# Patient Record
Sex: Female | Born: 1975 | Race: White | Hispanic: No | Marital: Single | Smoking: Former smoker
Health system: Southern US, Community
[De-identification: ages and names within clinical notes are randomized; demographics above are authoritative.]

## PROBLEM LIST (undated history)

## (undated) DIAGNOSIS — C439 Malignant melanoma of skin, unspecified: Secondary | ICD-10-CM

## (undated) DIAGNOSIS — F419 Anxiety disorder, unspecified: Secondary | ICD-10-CM

## (undated) DIAGNOSIS — R87619 Unspecified abnormal cytological findings in specimens from cervix uteri: Secondary | ICD-10-CM

## (undated) DIAGNOSIS — F319 Bipolar disorder, unspecified: Secondary | ICD-10-CM

## (undated) DIAGNOSIS — IMO0002 Reserved for concepts with insufficient information to code with codable children: Secondary | ICD-10-CM

---

## 1999-12-12 ENCOUNTER — Inpatient Hospital Stay (HOSPITAL_COMMUNITY): Admission: EM | Admit: 1999-12-12 | Discharge: 1999-12-19 | Payer: Self-pay | Admitting: *Deleted

## 1999-12-21 ENCOUNTER — Emergency Department (HOSPITAL_COMMUNITY): Admission: EM | Admit: 1999-12-21 | Discharge: 1999-12-21 | Payer: Self-pay | Admitting: Emergency Medicine

## 2000-06-20 ENCOUNTER — Encounter: Payer: Self-pay | Admitting: Emergency Medicine

## 2000-06-20 ENCOUNTER — Emergency Department (HOSPITAL_COMMUNITY): Admission: EM | Admit: 2000-06-20 | Discharge: 2000-06-20 | Payer: Self-pay | Admitting: Emergency Medicine

## 2010-11-21 ENCOUNTER — Inpatient Hospital Stay (HOSPITAL_COMMUNITY): Payer: Medicare Other

## 2010-11-21 ENCOUNTER — Inpatient Hospital Stay (HOSPITAL_COMMUNITY)
Admission: AD | Admit: 2010-11-21 | Discharge: 2010-11-21 | Disposition: A | Discharge status: Home / Self Care | Payer: Medicare Other | Source: Ambulatory Visit | Attending: Obstetrics & Gynecology | Admitting: Obstetrics & Gynecology

## 2010-11-21 DIAGNOSIS — R109 Unspecified abdominal pain: Secondary | ICD-10-CM | POA: Insufficient documentation

## 2010-11-21 DIAGNOSIS — O99891 Other specified diseases and conditions complicating pregnancy: Secondary | ICD-10-CM | POA: Insufficient documentation

## 2010-11-21 LAB — WET PREP, GENITAL
Trich, Wet Prep: NONE SEEN
Yeast Wet Prep HPF POC: NONE SEEN

## 2010-11-21 LAB — URINALYSIS, ROUTINE W REFLEX MICROSCOPIC
Bilirubin Urine: NEGATIVE
Glucose, UA: NEGATIVE mg/dL
Hgb urine dipstick: NEGATIVE
Ketones, ur: NEGATIVE mg/dL
Nitrite: NEGATIVE
Protein, ur: NEGATIVE mg/dL
Specific Gravity, Urine: >1.03 — ABNORMAL HIGH (ref 1.005–1.030)
Urobilinogen, UA: 1 mg/dL (ref 0.0–1.0)
pH: 5.5 (ref 5.0–8.0)

## 2010-11-21 LAB — CBC
HCT: 34.1 % — ABNORMAL LOW (ref 36.0–46.0)
Hemoglobin: 11.5 g/dL — ABNORMAL LOW (ref 12.0–15.0)
MCH: 29.6 pg (ref 26.0–34.0)
MCHC: 33.7 g/dL (ref 30.0–36.0)
MCV: 87.7 fL (ref 78.0–100.0)
Platelets: 251 10*3/uL (ref 150–400)
RBC: 3.89 MIL/uL (ref 3.87–5.11)
RDW: 12.6 % (ref 11.5–15.5)
WBC: 7.2 10*3/uL (ref 4.0–10.5)

## 2010-11-21 LAB — POCT PREGNANCY, URINE: Preg Test, Ur: POSITIVE

## 2010-11-21 LAB — HCG, QUANTITATIVE, PREGNANCY: hCG, Beta Chain, Quant, S: 107738 m[IU]/mL — ABNORMAL HIGH (ref <5)

## 2010-11-22 LAB — GC/CHLAMYDIA PROBE AMP, GENITAL: GC Probe Amp, Genital: NEGATIVE

## 2010-12-27 LAB — RPR: RPR: NONREACTIVE

## 2010-12-27 LAB — ANTIBODY SCREEN: Antibody Screen: NEGATIVE

## 2010-12-27 LAB — ABO/RH: RH Type: POSITIVE

## 2010-12-27 LAB — RUBELLA ANTIBODY, IGM: Rubella: IMMUNE

## 2011-01-04 ENCOUNTER — Ambulatory Visit (HOSPITAL_COMMUNITY)
Admission: RE | Admit: 2011-01-04 | Discharge: 2011-01-04 | Disposition: A | Discharge status: Home / Self Care | Payer: Medicaid Other | Source: Ambulatory Visit | Attending: Obstetrics & Gynecology | Admitting: Obstetrics & Gynecology

## 2011-03-07 ENCOUNTER — Inpatient Hospital Stay (HOSPITAL_COMMUNITY)
Admission: AD | Admit: 2011-03-07 | Discharge: 2011-03-07 | Disposition: A | Discharge status: Home / Self Care | Payer: Medicaid Other | Source: Ambulatory Visit | Attending: Obstetrics & Gynecology | Admitting: Obstetrics & Gynecology

## 2011-03-07 DIAGNOSIS — O9989 Other specified diseases and conditions complicating pregnancy, childbirth and the puerperium: Secondary | ICD-10-CM

## 2011-03-07 DIAGNOSIS — O4690 Antepartum hemorrhage, unspecified, unspecified trimester: Secondary | ICD-10-CM

## 2011-03-07 DIAGNOSIS — O99891 Other specified diseases and conditions complicating pregnancy: Secondary | ICD-10-CM | POA: Insufficient documentation

## 2011-03-07 DIAGNOSIS — O99340 Other mental disorders complicating pregnancy, unspecified trimester: Secondary | ICD-10-CM

## 2011-03-07 DIAGNOSIS — F319 Bipolar disorder, unspecified: Secondary | ICD-10-CM | POA: Insufficient documentation

## 2011-03-07 DIAGNOSIS — J45909 Unspecified asthma, uncomplicated: Secondary | ICD-10-CM | POA: Insufficient documentation

## 2011-03-07 LAB — URINALYSIS, ROUTINE W REFLEX MICROSCOPIC
Glucose, UA: NEGATIVE mg/dL
Hgb urine dipstick: NEGATIVE
Ketones, ur: NEGATIVE mg/dL
Protein, ur: NEGATIVE mg/dL

## 2011-03-07 LAB — WET PREP, GENITAL
Trich, Wet Prep: NONE SEEN
Yeast Wet Prep HPF POC: NONE SEEN

## 2011-06-12 ENCOUNTER — Encounter (HOSPITAL_COMMUNITY): Payer: Self-pay

## 2011-06-12 ENCOUNTER — Inpatient Hospital Stay (HOSPITAL_COMMUNITY)
Admission: AD | Admit: 2011-06-12 | Discharge: 2011-06-12 | Disposition: A | Discharge status: Home / Self Care | Payer: Medicaid Other | Source: Ambulatory Visit | Attending: Obstetrics & Gynecology | Admitting: Obstetrics & Gynecology

## 2011-06-12 DIAGNOSIS — O479 False labor, unspecified: Secondary | ICD-10-CM | POA: Insufficient documentation

## 2011-06-12 HISTORY — DX: Anxiety disorder, unspecified: F41.9

## 2011-06-12 HISTORY — DX: Bipolar disorder, unspecified: F31.9

## 2011-06-12 HISTORY — DX: Unspecified abnormal cytological findings in specimens from cervix uteri: R87.619

## 2011-06-12 HISTORY — DX: Malignant melanoma of skin, unspecified: C43.9

## 2011-06-12 HISTORY — DX: Reserved for concepts with insufficient information to code with codable children: IMO0002

## 2011-06-12 NOTE — Progress Notes (Signed)
Pt states she is having contractions every 9-10 minutes. Has not been able to sleep. Pt is scheduled for a repeat cesarean section on 10-10 with a due date of 10-17. No leaking or bleeding. Reports movement but not as much as usual.

## 2011-06-13 ENCOUNTER — Other Ambulatory Visit: Payer: Self-pay | Admitting: Obstetrics & Gynecology

## 2011-06-14 ENCOUNTER — Encounter (HOSPITAL_COMMUNITY): Payer: Self-pay | Admitting: *Deleted

## 2011-06-14 ENCOUNTER — Inpatient Hospital Stay (HOSPITAL_COMMUNITY)
Admission: AD | Admit: 2011-06-14 | Discharge: 2011-06-14 | Disposition: A | Discharge status: Home / Self Care | Payer: Medicaid Other | Source: Ambulatory Visit | Attending: Obstetrics & Gynecology | Admitting: Obstetrics & Gynecology

## 2011-06-14 DIAGNOSIS — O479 False labor, unspecified: Secondary | ICD-10-CM | POA: Insufficient documentation

## 2011-06-14 NOTE — ED Notes (Signed)
E-signature not working so pt signed paper stating received d/c instructions.

## 2011-06-14 NOTE — ED Notes (Signed)
Dr Gaynell Face notified of pt's admission and status. Aware of ctx pattern, sve x 2 without change in cervix. Aware of sch repeat c/s 10/10 and concern that cervix did not dilate with prior labor. Pt may go home.

## 2011-06-14 NOTE — ED Notes (Signed)
FOB stating pt is hurting and does not understand why C/S not done tonight. Pt did not dilate before and had to have emergent C/S due to FHR dropping. Discussed with pt and spouse ways to help pt be more comfortable. Labor progress discussed with couple.

## 2011-06-14 NOTE — ED Notes (Signed)
2233 When Dr Gaynell Face notified of pt's admission, first stated could give pt Terb and see if ctxs stopped. Told pt had taken self off monitor and was dressed sitting in chair. MD then stated pt could go home with labor precautions. Pt was aware Terb was an option but denied wanting that. States "I just want nature to take it's course".

## 2011-06-14 NOTE — Progress Notes (Signed)
G3P1 at 37.3wks. Hx prior C/S and desires C/S and sch for 10/10. Ctxs since 1030 and stronger since 1300. Denies leaking fld or bleeding.

## 2011-06-14 NOTE — Progress Notes (Signed)
Pt states, " I've had contractions since 10:30, and more regular at 1 pm. And they are 2-3 min apart. I might be leaking because my panties are staying damp."

## 2011-06-14 NOTE — Progress Notes (Signed)
Written and verbal d/c instructions given and understanding vocied. Discussed comfort measures-warm showers/baths, position changes.

## 2011-06-20 ENCOUNTER — Encounter (HOSPITAL_COMMUNITY): Payer: Self-pay | Admitting: Anesthesiology

## 2011-06-20 ENCOUNTER — Inpatient Hospital Stay (HOSPITAL_COMMUNITY): Payer: Medicare Other | Admitting: Anesthesiology

## 2011-06-20 ENCOUNTER — Other Ambulatory Visit: Payer: Self-pay | Admitting: Obstetrics & Gynecology

## 2011-06-20 ENCOUNTER — Encounter (HOSPITAL_COMMUNITY)
Admission: AD | Disposition: A | Discharge status: Home / Self Care | Payer: Self-pay | Source: Ambulatory Visit | Attending: Obstetrics & Gynecology

## 2011-06-20 ENCOUNTER — Inpatient Hospital Stay (HOSPITAL_COMMUNITY)
Admission: AD | Admit: 2011-06-20 | Discharge: 2011-06-23 | DRG: 766 | Disposition: A | Discharge status: Home / Self Care | Payer: Medicare Other | Source: Ambulatory Visit | Attending: Obstetrics & Gynecology | Admitting: Obstetrics & Gynecology

## 2011-06-20 ENCOUNTER — Encounter (HOSPITAL_COMMUNITY): Payer: Self-pay | Admitting: Surgery

## 2011-06-20 DIAGNOSIS — O34219 Maternal care for unspecified type scar from previous cesarean delivery: Principal | ICD-10-CM | POA: Diagnosis present

## 2011-06-20 DIAGNOSIS — Z302 Encounter for sterilization: Secondary | ICD-10-CM

## 2011-06-20 DIAGNOSIS — F1120 Opioid dependence, uncomplicated: Secondary | ICD-10-CM | POA: Diagnosis present

## 2011-06-20 LAB — CBC
MCHC: 33.8 g/dL (ref 30.0–36.0)
MCV: 91.9 fL (ref 78.0–100.0)
Platelets: 252 10*3/uL (ref 150–400)
RDW: 13.2 % (ref 11.5–15.5)
WBC: 9.6 10*3/uL (ref 4.0–10.5)

## 2011-06-20 LAB — TYPE AND SCREEN: Antibody Screen: NEGATIVE

## 2011-06-20 SURGERY — Surgical Case
Anesthesia: Spinal | Site: Abdomen | Wound class: Clean Contaminated

## 2011-06-20 MED ORDER — FENTANYL CITRATE 0.05 MG/ML IJ SOLN
INTRAMUSCULAR | Status: AC
  Administered 2011-06-20: 50 ug via INTRAVENOUS
  Filled 2011-06-20: qty 2

## 2011-06-20 MED ORDER — SCOPOLAMINE 1 MG/3DAYS TD PT72
MEDICATED_PATCH | TRANSDERMAL | Status: AC
  Administered 2011-06-20: 1.5 mg via TRANSDERMAL
  Filled 2011-06-20: qty 1

## 2011-06-20 MED ORDER — ONDANSETRON HCL 4 MG/2ML IJ SOLN
INTRAMUSCULAR | Status: DC | PRN
  Administered 2011-06-20: 4 mg via INTRAVENOUS

## 2011-06-20 MED ORDER — ACETAMINOPHEN 325 MG PO TABS: 325.0000 mg | ORAL_TABLET | ORAL | Status: DC | PRN

## 2011-06-20 MED ORDER — SCOPOLAMINE 1 MG/3DAYS TD PT72
1.0000 | MEDICATED_PATCH | TRANSDERMAL | Status: DC
  Administered 2011-06-20: 1.5 mg via TRANSDERMAL

## 2011-06-20 MED ORDER — PROMETHAZINE HCL 25 MG/ML IJ SOLN: 6.2500 mg | INTRAMUSCULAR | Status: DC | PRN

## 2011-06-20 MED ORDER — LACTATED RINGERS IV SOLN
INTRAVENOUS | Status: DC
  Administered 2011-06-21: 02:00:00 via INTRAVENOUS

## 2011-06-20 MED ORDER — DIPHENHYDRAMINE HCL 25 MG PO CAPS: 25.0000 mg | ORAL_CAPSULE | ORAL | Status: DC | PRN

## 2011-06-20 MED ORDER — METOCLOPRAMIDE HCL 5 MG/ML IJ SOLN: 10.0000 mg | Freq: Three times a day (TID) | INTRAMUSCULAR | Status: DC | PRN

## 2011-06-20 MED ORDER — KETOROLAC TROMETHAMINE 30 MG/ML IJ SOLN
30.0000 mg | Freq: Four times a day (QID) | INTRAMUSCULAR | Status: AC | PRN
  Administered 2011-06-21: 30 mg via INTRAVENOUS
  Filled 2011-06-20: qty 1

## 2011-06-20 MED ORDER — LANOLIN HYDROUS EX OINT: 1.0000 "application " | TOPICAL_OINTMENT | CUTANEOUS | Status: DC | PRN

## 2011-06-20 MED ORDER — ONDANSETRON HCL 4 MG/2ML IJ SOLN: 4.0000 mg | Freq: Three times a day (TID) | INTRAMUSCULAR | Status: DC | PRN

## 2011-06-20 MED ORDER — IBUPROFEN 600 MG PO TABS
600.0000 mg | ORAL_TABLET | Freq: Four times a day (QID) | ORAL | Status: DC | PRN
  Administered 2011-06-22 (×2): 600 mg via ORAL
  Filled 2011-06-20 (×5): qty 1

## 2011-06-20 MED ORDER — MEPERIDINE HCL 25 MG/ML IJ SOLN
INTRAMUSCULAR | Status: AC
  Administered 2011-06-20: 12.5 mg via INTRAVENOUS
  Filled 2011-06-20: qty 1

## 2011-06-20 MED ORDER — PRENATAL PLUS 27-1 MG PO TABS
1.0000 | ORAL_TABLET | Freq: Every day | ORAL | Status: DC
  Administered 2011-06-22 – 2011-06-23 (×3): 1 via ORAL
  Filled 2011-06-20 (×2): qty 1

## 2011-06-20 MED ORDER — FENTANYL CITRATE 0.05 MG/ML IJ SOLN
INTRAMUSCULAR | Status: DC | PRN
  Administered 2011-06-20: 25 ug via INTRATHECAL

## 2011-06-20 MED ORDER — MORPHINE SULFATE (PF) 0.5 MG/ML IJ SOLN
INTRAMUSCULAR | Status: DC | PRN
  Administered 2011-06-20: .1 mg via INTRATHECAL

## 2011-06-20 MED ORDER — SODIUM CHLORIDE 0.9 % IV SOLN: 1.0000 ug/kg/h | INTRAVENOUS | Status: DC | PRN

## 2011-06-20 MED ORDER — FENTANYL CITRATE 0.05 MG/ML IJ SOLN
25.0000 ug | INTRAMUSCULAR | Status: DC | PRN
  Administered 2011-06-20 (×4): 50 ug via INTRAVENOUS

## 2011-06-20 MED ORDER — ACETAMINOPHEN 10 MG/ML IV SOLN
1000.0000 mg | Freq: Four times a day (QID) | INTRAVENOUS | Status: AC | PRN
  Filled 2011-06-20: qty 100

## 2011-06-20 MED ORDER — NALBUPHINE HCL 10 MG/ML IJ SOLN: 5.0000 mg | INTRAMUSCULAR | Status: DC | PRN

## 2011-06-20 MED ORDER — BUPIVACAINE HCL 0.25 % IJ SOLN
INTRAMUSCULAR | Status: DC | PRN
  Administered 2011-06-20: 30 mL

## 2011-06-20 MED ORDER — OXYCODONE-ACETAMINOPHEN 5-325 MG PO TABS
1.0000 | ORAL_TABLET | ORAL | Status: DC | PRN
  Administered 2011-06-21 (×2): 1 via ORAL
  Administered 2011-06-21: 2 via ORAL
  Administered 2011-06-21: 1 via ORAL
  Administered 2011-06-22: 2 via ORAL
  Filled 2011-06-20 (×2): qty 1
  Filled 2011-06-20 (×2): qty 2
  Filled 2011-06-20: qty 1

## 2011-06-20 MED ORDER — ONDANSETRON HCL 4 MG/2ML IJ SOLN: 4.0000 mg | INTRAMUSCULAR | Status: DC | PRN

## 2011-06-20 MED ORDER — KETOROLAC TROMETHAMINE 60 MG/2ML IM SOLN
INTRAMUSCULAR | Status: AC
  Administered 2011-06-20: 60 mg
  Filled 2011-06-20: qty 2

## 2011-06-20 MED ORDER — OXYTOCIN 20 UNITS IN LACTATED RINGERS INFUSION - SIMPLE
INTRAVENOUS | Status: DC | PRN
  Administered 2011-06-20 (×2): 20 [IU] via INTRAVENOUS

## 2011-06-20 MED ORDER — LACTATED RINGERS IV SOLN
INTRAVENOUS | Status: DC
  Administered 2011-06-20: 1000 mL via INTRAVENOUS

## 2011-06-20 MED ORDER — TETANUS-DIPHTH-ACELL PERTUSSIS 5-2.5-18.5 LF-MCG/0.5 IM SUSP: 0.5000 mL | Freq: Once | INTRAMUSCULAR | Status: DC

## 2011-06-20 MED ORDER — LACTATED RINGERS IV SOLN
INTRAVENOUS | Status: DC | PRN
  Administered 2011-06-20 (×5): via INTRAVENOUS

## 2011-06-20 MED ORDER — BUPIVACAINE IN DEXTROSE 0.75-8.25 % IT SOLN
INTRATHECAL | Status: DC | PRN
  Administered 2011-06-20: 11 mg via INTRATHECAL

## 2011-06-20 MED ORDER — DIPHENHYDRAMINE HCL 50 MG/ML IJ SOLN: 25.0000 mg | INTRAMUSCULAR | Status: DC | PRN

## 2011-06-20 MED ORDER — FERROUS SULFATE 325 (65 FE) MG PO TABS
325.0000 mg | ORAL_TABLET | Freq: Two times a day (BID) | ORAL | Status: DC
  Administered 2011-06-23: 325 mg via ORAL
  Filled 2011-06-20 (×2): qty 1

## 2011-06-20 MED ORDER — METHADONE HCL 10 MG/ML PO CONC
115.0000 mg | Freq: Every day | ORAL | Status: DC
  Administered 2011-06-21 – 2011-06-23 (×3): 115 mg via ORAL
  Filled 2011-06-20 (×5): qty 11.5

## 2011-06-20 MED ORDER — ONDANSETRON HCL 4 MG PO TABS: 4.0000 mg | ORAL_TABLET | ORAL | Status: DC | PRN

## 2011-06-20 MED ORDER — SODIUM CHLORIDE 0.9 % IJ SOLN: 3.0000 mL | INTRAMUSCULAR | Status: DC | PRN

## 2011-06-20 MED ORDER — SENNOSIDES-DOCUSATE SODIUM 8.6-50 MG PO TABS
2.0000 | ORAL_TABLET | Freq: Every day | ORAL | Status: DC
  Administered 2011-06-21 – 2011-06-22 (×2): 2 via ORAL

## 2011-06-20 MED ORDER — OXYTOCIN 20 UNITS IN LACTATED RINGERS INFUSION - SIMPLE: 125.0000 mL/h | INTRAVENOUS | Status: AC

## 2011-06-20 MED ORDER — CEFAZOLIN SODIUM-DEXTROSE 2-3 GM-% IV SOLR
2.0000 g | Freq: Once | INTRAVENOUS | Status: AC
  Administered 2011-06-20: 2 g via INTRAVENOUS
  Filled 2011-06-20: qty 50

## 2011-06-20 MED ORDER — SODIUM CHLORIDE 0.9 % IR SOLN
Status: DC | PRN
  Administered 2011-06-20: 1000 mL

## 2011-06-20 MED ORDER — CITRIC ACID-SODIUM CITRATE 334-500 MG/5ML PO SOLN
30.0000 mL | Freq: Once | ORAL | Status: AC
  Administered 2011-06-20: 30 mL via ORAL
  Filled 2011-06-20: qty 15

## 2011-06-20 MED ORDER — KETOROLAC TROMETHAMINE 30 MG/ML IJ SOLN: 30.0000 mg | Freq: Four times a day (QID) | INTRAMUSCULAR | Status: AC | PRN

## 2011-06-20 MED ORDER — NALOXONE HCL 0.4 MG/ML IJ SOLN: 0.4000 mg | INTRAMUSCULAR | Status: DC | PRN

## 2011-06-20 MED ORDER — WITCH HAZEL-GLYCERIN EX PADS: 1.0000 "application " | MEDICATED_PAD | CUTANEOUS | Status: DC | PRN

## 2011-06-20 MED ORDER — SCOPOLAMINE 1 MG/3DAYS TD PT72: 1.0000 | MEDICATED_PATCH | Freq: Once | TRANSDERMAL | Status: DC

## 2011-06-20 MED ORDER — DIPHENHYDRAMINE HCL 50 MG/ML IJ SOLN: 12.5000 mg | INTRAMUSCULAR | Status: DC | PRN

## 2011-06-20 MED ORDER — SIMETHICONE 80 MG PO CHEW
80.0000 mg | CHEWABLE_TABLET | ORAL | Status: DC | PRN
  Administered 2011-06-21 – 2011-06-22 (×2): 80 mg via ORAL

## 2011-06-20 MED ORDER — DIBUCAINE 1 % RE OINT: 1.0000 "application " | TOPICAL_OINTMENT | RECTAL | Status: DC | PRN

## 2011-06-20 MED ORDER — IBUPROFEN 600 MG PO TABS
600.0000 mg | ORAL_TABLET | Freq: Four times a day (QID) | ORAL | Status: DC
  Administered 2011-06-21 – 2011-06-23 (×5): 600 mg via ORAL
  Filled 2011-06-20 (×2): qty 1

## 2011-06-20 MED ORDER — KETOROLAC TROMETHAMINE 30 MG/ML IJ SOLN: 30.0000 mg | Freq: Four times a day (QID) | INTRAMUSCULAR | Status: DC | PRN

## 2011-06-20 MED ORDER — MEASLES, MUMPS & RUBELLA VAC ~~LOC~~ INJ: 0.5000 mL | INJECTION | Freq: Once | SUBCUTANEOUS | Status: DC

## 2011-06-20 MED ORDER — MEPERIDINE HCL 25 MG/ML IJ SOLN
6.2500 mg | INTRAMUSCULAR | Status: AC | PRN
  Administered 2011-06-20 (×2): 12.5 mg via INTRAVENOUS

## 2011-06-20 MED ORDER — MAGNESIUM HYDROXIDE 400 MG/5ML PO SUSP: 30.0000 mL | ORAL | Status: DC | PRN

## 2011-06-20 SURGICAL SUPPLY — 45 items
BENZOIN TINCTURE PRP APPL 2/3 (GAUZE/BANDAGES/DRESSINGS) ×3 IMPLANT
CANISTER WOUND CARE 500ML ATS (WOUND CARE) IMPLANT
CHLORAPREP W/TINT 26ML (MISCELLANEOUS) ×3 IMPLANT
CLOSURE STERI STRIP 1/2 X4 (GAUZE/BANDAGES/DRESSINGS) ×3 IMPLANT
CLOTH BEACON ORANGE TIMEOUT ST (SAFETY) ×3 IMPLANT
CONTAINER PREFILL 10% NBF 15ML (MISCELLANEOUS) IMPLANT
DERMABOND ADVANCED (GAUZE/BANDAGES/DRESSINGS) ×1
DERMABOND ADVANCED .7 DNX12 (GAUZE/BANDAGES/DRESSINGS) ×2 IMPLANT
DRESSING TELFA 8X3 (GAUZE/BANDAGES/DRESSINGS) ×3 IMPLANT
DRSG PAD ABDOMINAL 8X10 ST (GAUZE/BANDAGES/DRESSINGS) ×3 IMPLANT
DRSG VAC ATS LRG SENSATRAC (GAUZE/BANDAGES/DRESSINGS) IMPLANT
DRSG VAC ATS MED SENSATRAC (GAUZE/BANDAGES/DRESSINGS) IMPLANT
DRSG VAC ATS SM SENSATRAC (GAUZE/BANDAGES/DRESSINGS) IMPLANT
ELECT REM PT RETURN 9FT ADLT (ELECTROSURGICAL) ×3
ELECTRODE REM PT RTRN 9FT ADLT (ELECTROSURGICAL) ×2 IMPLANT
EXTRACTOR VACUUM M CUP 4 TUBE (SUCTIONS) IMPLANT
GAUZE SPONGE 4X4 12PLY STRL LF (GAUZE/BANDAGES/DRESSINGS) ×6 IMPLANT
GLOVE BIO SURGEON STRL SZ 6.5 (GLOVE) ×6 IMPLANT
GOWN PREVENTION PLUS LG XLONG (DISPOSABLE) ×9 IMPLANT
KIT ABG SYR 3ML LUER SLIP (SYRINGE) IMPLANT
NEEDLE HYPO 25X5/8 SAFETYGLIDE (NEEDLE) ×3 IMPLANT
NS IRRIG 1000ML POUR BTL (IV SOLUTION) ×3 IMPLANT
PACK C SECTION WH (CUSTOM PROCEDURE TRAY) ×3 IMPLANT
PAD ABD 7.5X8 STRL (GAUZE/BANDAGES/DRESSINGS) ×3 IMPLANT
RTRCTR C-SECT PINK 25CM LRG (MISCELLANEOUS) ×3 IMPLANT
RTRCTR C-SECT PINK 34CM XLRG (MISCELLANEOUS) IMPLANT
SLEEVE SCD COMPRESS KNEE MED (MISCELLANEOUS) IMPLANT
SPONGE GAUZE 4X4 12PLY (GAUZE/BANDAGES/DRESSINGS) ×3 IMPLANT
STAPLER VISISTAT 35W (STAPLE) IMPLANT
SUT MNCRL 0 VIOLET CTX 36 (SUTURE) ×4 IMPLANT
SUT MNCRL AB 3-0 PS2 27 (SUTURE) ×3 IMPLANT
SUT MONOCRYL 0 CTX 36 (SUTURE) ×2
SUT PDS AB 0 CTX 36 PDP370T (SUTURE) ×3 IMPLANT
SUT PLAIN 0 NONE (SUTURE) IMPLANT
SUT VIC AB 0 CT1 27 (SUTURE)
SUT VIC AB 0 CT1 27XBRD ANBCTR (SUTURE) IMPLANT
SUT VIC AB 2-0 CT1 (SUTURE) IMPLANT
SUT VIC AB 2-0 CT1 27 (SUTURE) ×1
SUT VIC AB 2-0 CT1 TAPERPNT 27 (SUTURE) ×2 IMPLANT
SUT VIC AB 2-0 SH 27 (SUTURE)
SUT VIC AB 2-0 SH 27XBRD (SUTURE) IMPLANT
TAPE CLOTH SURG 4X10 WHT LF (GAUZE/BANDAGES/DRESSINGS) ×3 IMPLANT
TOWEL OR 17X24 6PK STRL BLUE (TOWEL DISPOSABLE) ×6 IMPLANT
TRAY FOLEY CATH 14FR (SET/KITS/TRAYS/PACK) ×3 IMPLANT
WATER STERILE IRR 1000ML POUR (IV SOLUTION) ×3 IMPLANT

## 2011-06-20 NOTE — H&P (Signed)
Theresa Boyer is a 34 y.o. female presenting for contractions. Maternal Medical History:  Reason for admission: Reason for admission: contractions.  Reason for Admission:   nauseaH/O a previous C/D.  Declines TOLAC.  Contractions: Onset was 2 days ago.   Frequency: regular.   Perceived severity is moderate.    Fetal activity: Perceived fetal activity is normal.    Prenatal complications: no prenatal complications   OB History    Grav Para Term Preterm Abortions TAB SAB Ect Mult Living   3 1 1  0 1 1 0 0 0 1     Past Medical History  Diagnosis Date  . Bipolar 1 disorder   . Anxiety   . Abnormal Pap smear   . Melanoma   . Asthma    Past Surgical History  Procedure Date  . Cesarean section    Family History: family history is not on file. Social History:  reports that she has been smoking.  She has never used smokeless tobacco. She reports that she uses illicit drugs. She reports that she does not drink alcohol.  Review of Systems  Constitutional: Negative for fever.  Eyes: Negative for blurred vision.  Respiratory: Negative for shortness of breath.   Gastrointestinal: Negative for nausea and vomiting.  Skin: Negative for rash.  Neurological: Negative for headaches.      Blood pressure 112/60, pulse 73, temperature 97.6 F (36.4 C), temperature source Oral, resp. rate 18, height 5\' 3"  (1.6 m), weight 82.101 kg (181 lb), SpO2 99.00%. Maternal Exam:  Uterine Assessment: Contraction strength is moderate.  Contraction frequency is irregular.   Abdomen: Patient reports no abdominal tenderness. Surgical scars: low transverse.   Fetal presentation: vertex  Introitus: Normal vulva. Cervix: Cervix evaluated by digital exam.     Fetal Exam Fetal Monitor Review: Variability: moderate (6-25 bpm).   Pattern: accelerations present and no decelerations.    Fetal State Assessment: Category I - tracings are normal.     Physical Exam  Constitutional: She appears  well-developed.  HENT:  Head: Normocephalic.  Neck: Neck supple. No thyromegaly present.  Cardiovascular: Normal rate and regular rhythm.   Respiratory: Breath sounds normal.  GI: Soft. Bowel sounds are normal.  Skin: No rash noted.    Prenatal labs: ABO, Rh: --/--/A POS (10/04 1701) Antibody: NEG (10/04 1701) Rubella: Immune (04/12 0000) RPR: Nonreactive (04/12 0000)  HBsAg: Negative (04/12 0000)  HIV: Non-reactive (04/12 0000)  GBS:     Assessment/Plan: 35 y.o. w/an IUP @ [redacted]w[redacted]d in early labor.  H/O a previous C/D, declines TOLAC.  Admit Repeat C/D   Boyer,Theresa Espejo A 06/20/2011, 6:04 PM

## 2011-06-20 NOTE — Anesthesia Postprocedure Evaluation (Signed)
Anesthesia Post Note  Patient: Theresa Boyer  Procedure(s) Performed:  CESAREAN SECTION WITH BILATERAL TUBAL LIGATION  Anesthesia type: Spinal  Patient location: PACU  Post pain: Pain level controlled  Post assessment: Post-op Vital signs reviewed  Last Vitals:  Filed Vitals:   06/20/11 2215  BP: 128/73  Pulse: 65  Temp:   Resp: 12    Post vital signs: Reviewed  Level of consciousness: awake  Complications: No apparent anesthesia complications

## 2011-06-20 NOTE — Op Note (Signed)
Cesarean Section/Modified [place] Bilateral Tubal Ligation Procedure Note   Theresa Boyer   06/20/2011  Indications: H/O previous C/D, declines TOLAC, IUP @ 38 wks, early labor, desires sterilization   Pre-operative Diagnosis: Term previous cesarean section.   Post-operative Diagnosis: Same   Surgeon: Antionette Char A  Assistants: none  Anesthesia: spinal, local.  Procedure Details:  The patient was seen in the Holding Room. The risks, benefits, complications, treatment options, and expected outcomes were discussed with the patient. The patient concurred with the proposed plan, giving informed consent. The patient was identified as [name] and the procedure verified as C-Section Delivery. A Time Out was held and the above information confirmed.  After induction of anesthesia, the patient was draped and prepped in the usual sterile manner. A transverse incision was made and carried down through the subcutaneous tissue to the fascia. The fascial incision was made and extended transversely. The fascia was separated from the underlying rectus tissue superiorly and inferiorly. The peritoneum was identified and entered. The peritoneal incision was extended longitudinally. The utero-vesical peritoneal reflection was incised transversely and the bladder flap was bluntly freed from the lower uterine segment. A low transverse uterine incision was made. Delivered from cephalic presentation was a living newborn female infant.   A cord ph was not sent. The umbilical cord was clamped and cut cord. A sample was obtained for evaluation. The placenta was removed Intact and appeared normal.  The uterine incision was closed with running locked sutures of 1-0 Monocryl. A second imbricating layer of the same suture was placed.  Hemostasis was observed. The left fallopian tube was identified and followed out to the fimbriated end.  The mid isthmic portion of the tube was grasped with a Babcock clamp.  A  1 - 2  cm segment of tube was doubly li gated with 0-plain and excised.  Adequate hemostasis was noted.  The right fallopian tube was manipulated in a similar fashion.   The paracolic gutters were irrigated. The fascia was then reapproximated with running sutures of 1-0 PDS. The subcuticular closure was performed using 3-0 Monocryl.  30 cc of 0.25% Marcaine was injected subcutaneously. Instrument, sponge, and needle counts were correct prior the abdominal closure and were correct at the conclusion of the case.    Findings:  See above.   Estimated Blood Loss: 600 ml  Total IV Fluids:  per Anesthesiology  Urine Output:   per Anesthesiology  Specimens:  Specimens    None       Complications: no complications  Disposition: PACU - hemodynamically stable.  Maternal Condition: stable   Baby condition / location:  nursery-stable    Signed: Surgeon(s): Roseanna Rainbow, MD

## 2011-06-20 NOTE — Progress Notes (Signed)
Pt sent over from office for repeat C-Section

## 2011-06-20 NOTE — Progress Notes (Signed)
Deceleration noted with a drop of baseline to 90 bpm lasting . Pt assisted to position change

## 2011-06-20 NOTE — Anesthesia Preprocedure Evaluation (Addendum)
Anesthesia Evaluation  Name, MR# and DOB Patient awake  General Assessment Comment  Reviewed: Allergy & Precautions, H&P , Patient's Chart, lab work & pertinent test results  Airway Mallampati: III TM Distance: >3 FB Neck ROM: full    Dental No notable dental hx.    Pulmonary asthma  clear to auscultation  Pulmonary exam normal       Cardiovascular + Cardiac Defibrillator regular Normal    Neuro/Psych Chronic pain on methadone Negative Neurological ROS  Negative Psych ROS   GI/Hepatic negative GI ROS Neg liver ROS    Endo/Other  Negative Endocrine ROS  Renal/GU negative Renal ROS     Musculoskeletal   Abdominal   Peds  Hematology negative hematology ROS (+)   Anesthesia Other Findings   Reproductive/Obstetrics (+) Pregnancy                          Anesthesia Physical Anesthesia Plan  ASA: III and Emergent  Anesthesia Plan: Spinal   Post-op Pain Management:    Induction:   Airway Management Planned:   Additional Equipment:   Intra-op Plan:   Post-operative Plan:   Informed Consent: I have reviewed the patients History and Physical, chart, labs and discussed the procedure including the risks, benefits and alternatives for the proposed anesthesia with the patient or authorized representative who has indicated his/her understanding and acceptance.     Plan Discussed with:   Anesthesia Plan Comments:         Anesthesia Quick Evaluation

## 2011-06-20 NOTE — Anesthesia Procedure Notes (Signed)
Spinal Block  Patient location during procedure: OR Start time: 06/20/2011 6:45 PM Staffing Performed by: anesthesiologist  Preanesthetic Checklist Completed: patient identified, site marked, surgical consent, pre-op evaluation, timeout performed, IV checked, risks and benefits discussed and monitors and equipment checked Spinal Block Patient position: sitting Prep: DuraPrep Patient monitoring: heart rate, cardiac monitor, continuous pulse ox and blood pressure Approach: midline Location: L3-4 Injection technique: single-shot Needle Needle type: Sprotte  Needle gauge: 24 G Needle length: 9 cm Assessment Sensory level: T4 Additional Notes Patient identified.  Risk benefits discussed including failed block, incomplete pain control, headache, nerve damage, paralysis, blood pressure changes, nausea, vomiting, reactions to medication both toxic or allergic, and postpartum back pain.  Patient expressed understanding and wished to proceed.  All questions were answered.  Sterile technique used throughout procedure.  CSF was clear.  No parasthesia or other complications.  Please see nursing notes for vital signs.

## 2011-06-20 NOTE — Transfer of Care (Signed)
Immediate Anesthesia Transfer of Care Note  Patient: Theresa Boyer  Procedure(s) Performed:  CESAREAN SECTION WITH BILATERAL TUBAL LIGATION  Patient Location: PACU  Anesthesia Type: Spinal  Level of Consciousness: awake, alert  and oriented  Airway & Oxygen Therapy: Patient Spontanous Breathing  Post-op Assessment: Report given to PACU RN and Post -op Vital signs reviewed and stable  Post vital signs: Reviewed and stable  Complications: No apparent anesthesia complications

## 2011-06-20 NOTE — OR Nursing (Signed)
CRNA Marrion Coy had to wait with patient in pacu until Rn Avail.  Pt arrive at 1952.

## 2011-06-21 MED ORDER — HYDROMORPHONE HCL 1 MG/ML IJ SOLN
2.0000 mg | Freq: Once | INTRAMUSCULAR | Status: AC
  Administered 2011-06-21: 2 mg via INTRAVENOUS
  Filled 2011-06-21: qty 2

## 2011-06-21 NOTE — Progress Notes (Signed)
Encounter addended by: Madison Hickman on: 06/21/2011 10:10 AM<BR>     Documentation filed: Notes Section

## 2011-06-21 NOTE — Progress Notes (Signed)
UR chart review completed.  

## 2011-06-21 NOTE — Anesthesia Postprocedure Evaluation (Signed)
  Anesthesia Post-op Note  Patient: Theresa Boyer  Procedure(s) Performed:  CESAREAN SECTION WITH BILATERAL TUBAL LIGATION  Patient Location: Mother/Baby  Anesthesia Type: Spinal  Level of Consciousness: awake, alert  and oriented  Airway and Oxygen Therapy: Patient Spontanous Breathing  Post-op Pain: none  Post-op Assessment: Post-op Vital signs reviewed, Patient's Cardiovascular Status Stable, No headache, No backache, No residual numbness and No residual motor weakness  Post-op Vital Signs: Reviewed and stable  Complications: No apparent anesthesia complications

## 2011-06-21 NOTE — Progress Notes (Signed)
  Subjective: POD# 1 s/p Cesarean Delivery.  Indications: early labor, prior C/D  RH status/Rubella reviewed. Feeding: unknown Patient reports tolerating PO.  Denies HA/SOB/C/P/N/V/dizziness.  Reports flatus or BM. Breast symptoms: None.  She reports vaginal bleeding as normal, without clots.  She is ambulating, urinating without difficulty.     Objective: Vital signs in last 24 hours: BP 104/66  Pulse 61  Temp(Src) 97.8 F (36.6 C) (Axillary)  Resp 18  Ht 5\' 3"  (1.6 m)  Wt 82.101 kg (181 lb)  BMI 32.06 kg/m2  SpO2 96%  Breastfeeding? Unknown       Physical Exam:  General: alert CV: Regular rate and rhythm Resp: clear Abdomen: soft, nontender, normal bowel sounds Lochia: minimal Uterine Fundus: firm, below umbilicus, nontender Incision: dressing C/D Ext: extremities normal, atraumatic, no cyanosis or edema    Basename 06/20/11 1700  HGB 11.9*  HCT 35.2*      Assessment/Plan: 35 y.o.  status post Cesarean section. POD# 1.   Doing well, stable.              Advance diet as tolerated Start po pain meds D/C foley  HLIV  Ambulate IS Routine post-op care  JACKSON-MOORE,Lively Haberman A 06/21/2011, 8:45 AM

## 2011-06-21 NOTE — Progress Notes (Signed)
PSYCHOSOCIAL ASSESSMENT ~ MATERNAL/CHILD  Name: Theresa Boyer Age: 35  Referral Date: 71 / 05 / 12  Reason/Source: Social situation / CN  I. FAMILY/HOME ENVIRONMENT  A. Child's Legal Guardian __X_Parent(s) ___Grandparent ___Foster parent ___DSS_________________  Name: Mike Gip DOB: // Age: 61  Address: [address] [place], [place] [address]  Name: Vinnie Langton DOB: // Age: 67  Address: (same as above)  B. Other Household Members/Support Persons Name: Madelon Lips Relationship: daughter DOB 09/30/08  Name: Relationship: DOB ___/___/___  Name: Relationship: DOB ___/___/___  Name: Relationship: DOB ___/___/___  C. Other Support: Pt's father  II. PSYCHOSOCIAL DATA A. Information Source _X_Patient Interview __Family Interview __Other___________ B. Surveyor, quantity and [employer] __Employment:  _X_Medicaid [place]: [place] __Private Insurance: __Self Pay  __Food Stamps __WIC __Work First __Public Housing __Section 8  __Maternity Care Coordination/Child Service Coordination/Early Intervention  ___School: Grade:  __Other:  Salena Saner Cultural and Environment Information Cultural Issues Impacting Care:  III. STRENGTHS _X__Supportive family/friends  _X__Adequate Resources  ___Compliance with medical plan  _X__Home prepared for Child (including basic supplies)  ___Understanding of illness  ___Other  RISK FACTORS AND CURRENT PROBLEMS ____No Problems Noted  Depression  Bipolar  Substance use: heroin use hx  IV. SOCIAL WORK ASSESSMENT Sw met with MOB and FOB to assess social situation re: hx of substance use and mental illnesses. Pt and FOB live together with their 2 yr old daughter. Pt was diagnosed with bipolar disorder and depression in childhood. She has taken medication to treat symptoms in the past. Prior to pregnancy confirmation, she was taking Depakote ER, Trazodone, Seroquel and Wellbutrin. She continued to take Seroquel throughout the pregnancy and Lorazepam 5mg  (as needed). Pt told Sw  that she was able to cope well without all medication. FOB is at the bedside and supportive. She plans to follow up with her PCP in [place] for a referral to start psychiatry treatment at discharge. She denies any depression during the pregnancy or at the present time. 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 Pt is currently receiving methadone treatment at [facility]. She started treatment 06/23/10 and is currently prescribed 115 mg, daily. She denies other illegal substance use during the pregnancy and expressed confidence that drug screen results will be negative. UDS is negative. Meconium result are pending. Pt is aware of the possibility that the infant may require NICU admission, as she was informed by the staff at the methadone clinic. Parents are hopeful that they can discharge home with the baby but prepared to visit if NICU admission required. Pt is in the process of selecting a pediatrician. Pt states that she has all the supplies for the infant. Sw will continue to follow and assist family as needed.  V. SOCIAL WORK PLAN _X__No Further Intervention Required/No Barriers to Discharge  ___Psychosocial Support and Ongoing Assessment of Needs  ___Patient/Family Education:  ___Child Protective Services Report County___________ Date___/____/____  ___Information/Referral to [employer]_________________________  ___Other:

## 2011-06-22 NOTE — Progress Notes (Signed)
  Subjective: POD# 2 s/p Cesarean Delivery.  Indications: early labor, declined TOLAC  RH status/Rubella reviewed. Feeding: breast Patient reports tolerating PO.  Denies HA/SOB/C/P/N/V/dizziness.  Reports flatus or BM. Breast symptoms: None.  She reports vaginal bleeding as normal, without clots.  She is ambulating, urinating without difficulty.     Objective: Vital signs in last 24 hours: BP 126/76  Pulse 74  Temp(Src) 98.7 F (37.1 C) (Oral)  Resp 18  Ht 5\' 3"  (1.6 m)  Wt 82.101 kg (181 lb)  BMI 32.06 kg/m2  SpO2 97%  Breastfeeding? Unknown       Physical Exam:  General: alert CV: Regular rate and rhythm Resp: clear Abdomen: soft, nontender, normal bowel sounds Lochia: none Uterine Fundus: firm, below umbilicus, nontender Incision: clean, dry and intact Ext: extremities normal, atraumatic, no cyanosis or edema    Basename 06/21/11 0839 06/20/11 1700  HGB 10.7* 11.9*  HCT -- 35.2*      Assessment/Plan: 35 y.o.  status post Cesarean section. POD# 2.   Doing well, stable.               HLIV  Ambulate IS Routine post-op care  JACKSON-MOORE,Jary Louvier A 06/22/2011, 1:59 PM

## 2011-06-23 MED ORDER — IBUPROFEN 600 MG PO TABS: 600.0000 mg | ORAL_TABLET | Freq: Four times a day (QID) | ORAL | Status: AC | PRN

## 2011-06-23 MED ORDER — OXYCODONE-ACETAMINOPHEN 5-325 MG PO TABS: 1.0000 | ORAL_TABLET | ORAL | Status: AC | PRN

## 2011-06-23 NOTE — Progress Notes (Signed)
PSYCHOSOCIAL ASSESSMENT ~ MATERNAL/CHILD  Name: Theresa Boyer Age: 35  Referral Date: 10 / 05 / 12  Reason/Source: NICU Admission  I. FAMILY/HOME ENVIRONMENT  A. Child's Legal Guardian __X_Parent(s) ___Grandparent ___Foster parent ___DSS_________________  Name: Theresa Boyer DOB: // Age: 34  Address: [address] [place], [place] [address]  Name: Theresa Boyer DOB: // Age: 40  Address: (same as above)  B. Other Household Members/Support Persons Name: Theresa Boyer Relationship: daughter DOB 09/30/08  Name: Relationship: DOB ___/___/___  Name: Relationship: DOB ___/___/___  Name: Relationship: DOB ___/___/___  C. Other Support: Pt's father  II. PSYCHOSOCIAL DATA A. Information Source _X_Patient Interview __Family Interview __Other___________ B. Financial and Community Resources __Employment:  _X_Medicaid County: [place] __Private Insurance: __Self Pay  __Food Stamps __WIC __Work First __Public Housing __Section 8  __Maternity Care Coordination/Child Service Coordination/Early Intervention  ___School: Grade:  __Other:  C. Cultural and Environment Information Cultural Issues Impacting Care:  III. STRENGTHS _X__Supportive family/friends  _X__Adequate Resources  ___Compliance with medical plan  _X__Home prepared for Child (including basic supplies)  ___Understanding of illness  ___Other  RISK FACTORS AND CURRENT PROBLEMS ____No Problems Noted  Depression  Bipolar  Substance use: heroin use hx  IV. SOCIAL WORK ASSESSMENT CSW met with patient and provided NICU brochure.  CSW had seen patient previously and pt expressed she was prepared for possible NICU admit.  Pt at this time does not have any concerns emotionally due to NICU admit with history .  Pt feels information on infant is being communicated well and she understands need for NICU admit.  Will continue to follow patient while infant in NICU.     V. SOCIAL WORK PLAN ___No Further Intervention Required/No Barriers to Discharge    _X__Psychosocial Support and Ongoing Assessment of Needs  ___Patient/Family Education:  ___Child Protective Services Report County___________ Date___/____/____  ___Information/Referral to Community Resources_________________________  ___Other:   

## 2011-06-23 NOTE — Progress Notes (Signed)
Subjective: POD #3 s/p LTC/S  Indication:Early labor, declined TOLAC Patient reports tolerating PO.  Denies HA/SOB/C/P/N/V/dizziness.  Reports flatus or BM. Breast symptoms: None  She reports vaginal bleeding as normal, without clots.  She is ambulating, urinating without difficulty.     Objective: Vital signs in last 24 hours: BP 129/75  Pulse 71  Temp(Src) 98.2 F (36.8 C) (Oral)  Resp 18  Ht 5\' 3"  (1.6 m)  Wt 82.101 kg (181 lb)  BMI 32.06 kg/m2  SpO2 97%  Breastfeeding? Unknown  Physical Exam:  General: alert CV: Regular rate and rhythm Resp: clear Abdomen: soft, nontender, normal bowel sounds Lochia: minimal Uterine Fundus: firm, below umbilicus, nontender Incision: clean, dry, intact and small ecchymosis Ext: extremities normal, atraumatic, no cyanosis or edema  Basename 06/21/11 0839 06/20/11 1700  HGB 10.7* 11.9*  HCT -- 35.2*    Assessment/Plan: 35 y.o. status post Cesarean section POD# 3.  normal post-operative exam  Routine post-op care D/C home  JACKSON-MOORE,[place] Whedbee A 06/23/2011, 11:01 AM

## 2011-06-23 NOTE — Discharge Summary (Signed)
Obstetric Discharge Summary Reason for Admission: onset of labor and cesarean section Prenatal Procedures: none Intrapartum Procedures: cesarean: low cervical, transverse and tubal ligation Postpartum Procedures: none Complications-Operative and Postpartum: none Hemoglobin  Date Value Range Status  06/21/2011 10.7* 12.0-15.0 (g/dL) Final     HCT  Date Value Range Status  06/20/2011 35.2* 36.0-46.0 (%) Final    Discharge Diagnoses: Term Pregnancy-delivered  Discharge Information: Date: 06/23/2011 Activity: pelvic rest Diet: routine Medications: PNV, Ibuprofen, Percocet and Methadone Condition: stable Instructions: See above Discharge to: home Follow-up Information    Follow up with Antionette Char A, MD. Call in 2 weeks.   Contact information:   [address] [place] [address] [phone]          Newborn Data: Live born female  Birth Weight: 6 lb 1.4 oz (2760 g) APGAR: 8, 9    JACKSON-MOORE,[place] Markgraf A 06/23/2011, 11:12 AM

## 2011-06-24 ENCOUNTER — Other Ambulatory Visit (HOSPITAL_COMMUNITY): Payer: Medicaid Other

## 2011-06-26 ENCOUNTER — Encounter (HOSPITAL_COMMUNITY): Admission: RE | Payer: Self-pay | Source: Ambulatory Visit

## 2011-06-26 ENCOUNTER — Inpatient Hospital Stay (HOSPITAL_COMMUNITY)
Admission: RE | Admit: 2011-06-26 | Payer: Medicare Other | Source: Ambulatory Visit | Admitting: Obstetrics & Gynecology

## 2011-06-26 SURGERY — Surgical Case
Anesthesia: Regional

## 2011-07-06 ENCOUNTER — Inpatient Hospital Stay (HOSPITAL_COMMUNITY)
Admission: AD | Admit: 2011-07-06 | Discharge: 2011-07-06 | Disposition: A | Discharge status: Home / Self Care | Payer: Medicare Other | Source: Ambulatory Visit | Attending: Obstetrics & Gynecology | Admitting: Obstetrics & Gynecology

## 2011-07-06 ENCOUNTER — Encounter (HOSPITAL_COMMUNITY): Payer: Self-pay

## 2011-07-06 DIAGNOSIS — O909 Complication of the puerperium, unspecified: Secondary | ICD-10-CM | POA: Insufficient documentation

## 2011-07-06 DIAGNOSIS — R209 Unspecified disturbances of skin sensation: Secondary | ICD-10-CM

## 2011-07-06 DIAGNOSIS — L7682 Other postprocedural complications of skin and subcutaneous tissue: Secondary | ICD-10-CM

## 2011-07-06 LAB — URINALYSIS, ROUTINE W REFLEX MICROSCOPIC
Bilirubin Urine: NEGATIVE
Glucose, UA: NEGATIVE mg/dL
Ketones, ur: NEGATIVE mg/dL
Nitrite: NEGATIVE
Protein, ur: NEGATIVE mg/dL
Specific Gravity, Urine: 1.025 (ref 1.005–1.030)
Urobilinogen, UA: 0.2 mg/dL (ref 0.0–1.0)
pH: 7 (ref 5.0–8.0)

## 2011-07-06 LAB — DIFFERENTIAL
Basophils Absolute: 0 10*3/uL (ref 0.0–0.1)
Lymphocytes Relative: 33 % (ref 12–46)
Lymphs Abs: 2.2 10*3/uL (ref 0.7–4.0)
Monocytes Absolute: 0.5 10*3/uL (ref 0.1–1.0)
Monocytes Relative: 7 % (ref 3–12)
Neutro Abs: 3.8 10*3/uL (ref 1.7–7.7)

## 2011-07-06 LAB — CBC
HCT: 37.5 % (ref 36.0–46.0)
Hemoglobin: 12.1 g/dL (ref 12.0–15.0)
RBC: 3.97 MIL/uL (ref 3.87–5.11)
RDW: 12.6 % (ref 11.5–15.5)
WBC: 6.7 10*3/uL (ref 4.0–10.5)

## 2011-07-06 LAB — URINE MICROSCOPIC-ADD ON

## 2011-07-06 MED ORDER — OXYCODONE-ACETAMINOPHEN 5-325 MG PO TABS
1.0000 | ORAL_TABLET | Freq: Once | ORAL | Status: AC
  Administered 2011-07-06: 1 via ORAL
  Filled 2011-07-06: qty 1

## 2011-07-06 MED ORDER — OXYCODONE-ACETAMINOPHEN 5-325 MG PO TABS: 1.0000 | ORAL_TABLET | ORAL | Status: AC | PRN

## 2011-07-06 NOTE — Progress Notes (Addendum)
Patient had a repeat c-section on 10/4. She states that she was seen at her dr's office yesterday for c-section site tenderness, pain and some drainage. She was prescribed keflex 4 times a day which she states that she has been taking as prescribed. No drainage noted from site, right side of site is reddened and left side more tender to touch. She last took ibuprophen 600mg  at 8am. She denies chills. Patient's newborn in NICU. She is breastfeeding.

## 2011-07-06 NOTE — ED Provider Notes (Signed)
History     Chief Complaint  Patient presents with  . Wound Infection   HPI Theresa Boyer 35 y.o. S/P cesarean on 06-20-11.  Having pain in lower abdomen and at incision site.  Was seen in the office yesterday and is taking Keflex which was prescribed yesterday.  Awakened with pain during the night and was not able to go back to sleep.  Took one percocet which helped her rest.  History of methadone use during the pregnancy.   OB History    Grav Para Term Preterm Abortions TAB SAB Ect Mult Living   3 2 2  0 1 1 0 0 0 2      Past Medical History  Diagnosis Date  . Bipolar 1 disorder   . Anxiety   . Abnormal Pap smear   . Melanoma   . Asthma     Past Surgical History  Procedure Date  . Cesarean section     x 2    No family history on file.  History  Substance Use Topics  . Smoking status: Current Everyday Smoker -- 0.5 packs/day  . Smokeless tobacco: Never Used  . Alcohol Use: No    Allergies:  Allergies  Allergen Reactions  . Lamictal (Lamotrigine) Anaphylaxis  . Lithium Anaphylaxis  . Wellbutrin (Bupropion Hcl) Other (See Comments)    Causes rage     Prescriptions prior to admission  Medication Sig Dispense Refill  . methadone (DOLOPHINE) 10 MG/ML solution Take 115 mg by mouth daily.        . Oxycodone-Acetaminophen (PERCOCET PO) Take 1 tablet by mouth daily as needed. For pain      . prenatal vitamin w/FE, FA (PRENATAL 1 + 1) 27-1 MG TABS Take 1 tablet by mouth daily.        . clonazePAM (KLONOPIN) 1 MG tablet Take 0.5 mg by mouth 3 (three) times daily as needed. For anxiety         Review of Systems  Gastrointestinal: Positive for abdominal pain.   Physical Exam   Blood pressure 149/97, pulse 60, temperature 98.4 F (36.9 C), temperature source Oral, resp. rate 18, currently breastfeeding.  Physical Exam  Nursing note and vitals reviewed. Constitutional: She is oriented to person, place, and time. She appears well-developed and well-nourished.    HENT:  Head: Normocephalic.  Eyes: EOM are normal.  Neck: Neck supple.  GI: Soft. There is tenderness. There is no guarding.       C/S incision closed - no drainage. Incision inflammed and pink. Lower abdomen flushed pink to 2 cm below the umbilicus. Tender to light palpation.   Musculoskeletal: Normal range of motion.  Neurological: She is alert and oriented to person, place, and time.  Skin: Skin is warm and dry.  Psychiatric: She has a normal mood and affect.    MAU Course  Procedures Given one Percocet for pain  MDM Results for orders placed during the hospital encounter of 07/06/11 (from the past 24 hour(s))  URINALYSIS, ROUTINE W REFLEX MICROSCOPIC     Status: Abnormal   Collection Time   07/06/11  9:15 PM      Component Value Range   Color, Urine YELLOW  YELLOW    Appearance CLEAR  CLEAR    Specific Gravity, Urine 1.025  1.005 - 1.030    pH 7.0  5.0 - 8.0    Glucose, UA NEGATIVE  NEGATIVE (mg/dL)   Hgb urine dipstick MODERATE (*) NEGATIVE    Bilirubin Urine NEGATIVE  NEGATIVE    Ketones, ur NEGATIVE  NEGATIVE (mg/dL)   Protein, ur NEGATIVE  NEGATIVE (mg/dL)   Urobilinogen, UA 0.2  0.0 - 1.0 (mg/dL)   Nitrite NEGATIVE  NEGATIVE    Leukocytes, UA SMALL (*) NEGATIVE   URINE MICROSCOPIC-ADD ON     Status: Abnormal   Collection Time   07/06/11  9:15 PM      Component Value Range   Squamous Epithelial / LPF RARE  RARE    WBC, UA 3-6  <3 (WBC/hpf)   RBC / HPF 3-6  <3 (RBC/hpf)   Bacteria, UA FEW (*) RARE   CBC     Status: Normal   Collection Time   07/06/11  9:48 PM      Component Value Range   WBC 6.7  4.0 - 10.5 (K/uL)   RBC 3.97  3.87 - 5.11 (MIL/uL)   Hemoglobin 12.1  12.0 - 15.0 (g/dL)   HCT 91.4  78.2 - 95.6 (%)   MCV 94.5  78.0 - 100.0 (fL)   MCH 30.5  26.0 - 34.0 (pg)   MCHC 32.3  30.0 - 36.0 (g/dL)   RDW 21.3  08.6 - 57.8 (%)   Platelets 298  150 - 400 (K/uL)  DIFFERENTIAL     Status: Normal   Collection Time   07/06/11  9:48 PM      Component  Value Range   Neutrophils Relative 57  43 - 77 (%)   Neutro Abs 3.8  1.7 - 7.7 (K/uL)   Lymphocytes Relative 33  12 - 46 (%)   Lymphs Abs 2.2  0.7 - 4.0 (K/uL)   Monocytes Relative 7  3 - 12 (%)   Monocytes Absolute 0.5  0.1 - 1.0 (K/uL)   Eosinophils Relative 3  0 - 5 (%)   Eosinophils Absolute 0.2  0.0 - 0.7 (K/uL)   Basophils Relative 0  0 - 1 (%)   Basophils Absolute 0.0  0.0 - 0.1 (K/uL)   Urine culture pending Consult with Dr. Gaynell Face re plan of care.  Assessment and Plan  Incisional pain  Abdominal pain  Plan: Continue antibiotics Return if you develop fever. Be seen in the office on Monday if no improvement. Rx given for 2 percocet tablets. Increase PO fluids Rest frequently.   Zakia Sainato 07/06/2011, 9:51 PM   Nolene Bernheim, NP 07/06/11 2225

## 2011-07-06 NOTE — Discharge Instructions (Signed)
You do not have a fever today.  Your blood work is normal. Continue warm soaks to abdomen if needed for pain. Rest frequently Continue and finish antibiotics See your doctor on Monday if there is no improvement. Drink at least 8 8-oz glasses of water every day. Alternate Tylenol and Ibuprofen to help with pain management.

## 2011-07-06 NOTE — Progress Notes (Signed)
Pt states, " I had a C/S on 06-20-11. My incision started getting red and puffy two days after I delivered. I've been on Keflex since yesterday. It has been draining in the center for a week, and it more painful and I have a stabbing pain in my low abdomen when I pee. I've been having chills all day and think I'm starting to run a fever."

## 2011-07-08 LAB — URINE CULTURE: Culture  Setup Time: 201210210414

## 2014-07-18 ENCOUNTER — Encounter (HOSPITAL_COMMUNITY): Payer: Self-pay

## 2020-01-27 ENCOUNTER — Ambulatory Visit: Payer: Medicare Other | Attending: Internal Medicine

## 2020-01-27 DIAGNOSIS — Z23 Encounter for immunization: Secondary | ICD-10-CM

## 2020-01-27 NOTE — Progress Notes (Signed)
   Covid-19 Vaccination Clinic  Name:  Theresa Boyer    MRN: [id number] DOB: 1976-07-18  01/27/2020  [name] was observed post Covid-19 immunization for 15 minutes without incident. She was provided with Vaccine Information Sheet and instruction to access the V-Safe system.   [name] was instructed to call 911 with any severe reactions post vaccine: Marland Kitchen Difficulty breathing  . Swelling of face and throat  . A fast heartbeat  . A bad rash all over body  . Dizziness and weakness   Immunizations Administered    Name Date Dose VIS Date Route   Moderna COVID-19 Vaccine 01/27/2020  1:55 PM 0.5 mL 08/2019 Intramuscular   Manufacturer: Moderna   Lot: [id number]   [id number]

## 2020-02-24 ENCOUNTER — Ambulatory Visit: Payer: Medicare Other | Attending: Internal Medicine

## 2020-09-27 ENCOUNTER — Other Ambulatory Visit
Admission: RE | Admit: 2020-09-27 | Discharge: 2020-09-27 | Disposition: A | Discharge status: Home / Self Care | Payer: Medicare Other | Source: Ambulatory Visit | Attending: Orthopedic Surgery | Admitting: Orthopedic Surgery

## 2020-09-27 DIAGNOSIS — I110 Hypertensive heart disease with heart failure: Secondary | ICD-10-CM | POA: Diagnosis not present

## 2020-09-27 DIAGNOSIS — I5021 Acute systolic (congestive) heart failure: Secondary | ICD-10-CM | POA: Diagnosis not present

## 2020-09-27 DIAGNOSIS — R059 Cough, unspecified: Secondary | ICD-10-CM | POA: Insufficient documentation

## 2020-09-27 LAB — FIBRIN DERIVATIVES D-DIMER (ARMC ONLY): Fibrin derivatives D-dimer (ARMC): 649.75 ng/mL (FEU) — ABNORMAL HIGH (ref 0.00–499.00)

## 2020-09-28 ENCOUNTER — Inpatient Hospital Stay
Admission: EM | Admit: 2020-09-28 | Discharge: 2020-09-30 | DRG: 291 | Disposition: A | Discharge status: Home / Self Care | Payer: Medicare Other | Attending: Internal Medicine | Admitting: Internal Medicine

## 2020-09-28 ENCOUNTER — Emergency Department: Payer: Medicare Other

## 2020-09-28 ENCOUNTER — Other Ambulatory Visit: Payer: Self-pay

## 2020-09-28 DIAGNOSIS — Z6830 Body mass index (BMI) 30.0-30.9, adult: Secondary | ICD-10-CM

## 2020-09-28 DIAGNOSIS — F1510 Other stimulant abuse, uncomplicated: Secondary | ICD-10-CM | POA: Diagnosis present

## 2020-09-28 DIAGNOSIS — I739 Peripheral vascular disease, unspecified: Secondary | ICD-10-CM | POA: Diagnosis present

## 2020-09-28 DIAGNOSIS — R778 Other specified abnormalities of plasma proteins: Secondary | ICD-10-CM

## 2020-09-28 DIAGNOSIS — I5021 Acute systolic (congestive) heart failure: Secondary | ICD-10-CM | POA: Diagnosis present

## 2020-09-28 DIAGNOSIS — Z85820 Personal history of malignant melanoma of skin: Secondary | ICD-10-CM

## 2020-09-28 DIAGNOSIS — I509 Heart failure, unspecified: Secondary | ICD-10-CM | POA: Diagnosis not present

## 2020-09-28 DIAGNOSIS — F1910 Other psychoactive substance abuse, uncomplicated: Secondary | ICD-10-CM | POA: Diagnosis present

## 2020-09-28 DIAGNOSIS — E669 Obesity, unspecified: Secondary | ICD-10-CM | POA: Diagnosis present

## 2020-09-28 DIAGNOSIS — I10 Essential (primary) hypertension: Secondary | ICD-10-CM | POA: Diagnosis not present

## 2020-09-28 DIAGNOSIS — F319 Bipolar disorder, unspecified: Secondary | ICD-10-CM | POA: Diagnosis present

## 2020-09-28 DIAGNOSIS — I110 Hypertensive heart disease with heart failure: Principal | ICD-10-CM | POA: Diagnosis present

## 2020-09-28 DIAGNOSIS — I447 Left bundle-branch block, unspecified: Secondary | ICD-10-CM | POA: Diagnosis present

## 2020-09-28 DIAGNOSIS — Z888 Allergy status to other drugs, medicaments and biological substances status: Secondary | ICD-10-CM

## 2020-09-28 DIAGNOSIS — I248 Other forms of acute ischemic heart disease: Secondary | ICD-10-CM | POA: Diagnosis present

## 2020-09-28 DIAGNOSIS — F419 Anxiety disorder, unspecified: Secondary | ICD-10-CM | POA: Diagnosis present

## 2020-09-28 DIAGNOSIS — J45909 Unspecified asthma, uncomplicated: Secondary | ICD-10-CM | POA: Diagnosis present

## 2020-09-28 DIAGNOSIS — Z20822 Contact with and (suspected) exposure to covid-19: Secondary | ICD-10-CM | POA: Diagnosis present

## 2020-09-28 DIAGNOSIS — F1120 Opioid dependence, uncomplicated: Secondary | ICD-10-CM | POA: Diagnosis present

## 2020-09-28 DIAGNOSIS — Z79899 Other long term (current) drug therapy: Secondary | ICD-10-CM

## 2020-09-28 DIAGNOSIS — F17210 Nicotine dependence, cigarettes, uncomplicated: Secondary | ICD-10-CM | POA: Diagnosis present

## 2020-09-28 DIAGNOSIS — R7989 Other specified abnormal findings of blood chemistry: Secondary | ICD-10-CM

## 2020-09-28 DIAGNOSIS — E876 Hypokalemia: Principal | ICD-10-CM

## 2020-09-28 DIAGNOSIS — Z720 Tobacco use: Secondary | ICD-10-CM | POA: Diagnosis present

## 2020-09-28 LAB — CBC WITH DIFFERENTIAL/PLATELET
Abs Immature Granulocytes: 0.02 10*3/uL (ref 0.00–0.07)
Basophils Absolute: 0.1 10*3/uL (ref 0.0–0.1)
Basophils Relative: 1 %
Eosinophils Absolute: 0.2 10*3/uL (ref 0.0–0.5)
Eosinophils Relative: 2 %
HCT: 35.5 % — ABNORMAL LOW (ref 36.0–46.0)
Hemoglobin: 11.2 g/dL — ABNORMAL LOW (ref 12.0–15.0)
Immature Granulocytes: 0 %
Lymphocytes Relative: 24 %
Lymphs Abs: 1.9 10*3/uL (ref 0.7–4.0)
MCH: 27.3 pg (ref 26.0–34.0)
MCHC: 31.5 g/dL (ref 30.0–36.0)
MCV: 86.4 fL (ref 80.0–100.0)
Monocytes Absolute: 0.3 10*3/uL (ref 0.1–1.0)
Monocytes Relative: 3 %
Neutro Abs: 5.7 10*3/uL (ref 1.7–7.7)
Neutrophils Relative %: 70 %
Platelets: 282 10*3/uL (ref 150–400)
RBC: 4.11 MIL/uL (ref 3.87–5.11)
RDW: 15.3 % (ref 11.5–15.5)
WBC: 8.1 10*3/uL (ref 4.0–10.5)
nRBC: 0 % (ref 0.0–0.2)

## 2020-09-28 LAB — TROPONIN I (HIGH SENSITIVITY)
Troponin I (High Sensitivity): 187 ng/L (ref <18)
Troponin I (High Sensitivity): 180 ng/L (ref <18)

## 2020-09-28 LAB — RESP PANEL BY RT-PCR (FLU A&B, COVID) ARPGX2
Influenza A by PCR: NEGATIVE
Influenza B by PCR: NEGATIVE
SARS Coronavirus 2 by RT PCR: NEGATIVE

## 2020-09-28 LAB — BASIC METABOLIC PANEL
Anion gap: 9 (ref 5–15)
BUN: 17 mg/dL (ref 6–20)
CO2: 24 mmol/L (ref 22–32)
Calcium: 8.7 mg/dL — ABNORMAL LOW (ref 8.9–10.3)
Chloride: 105 mmol/L (ref 98–111)
Creatinine, Ser: 0.81 mg/dL (ref 0.44–1.00)
GFR, Estimated: >60 mL/min (ref >60)
Glucose, Bld: 104 mg/dL — ABNORMAL HIGH (ref 70–99)
Potassium: 4 mmol/L (ref 3.5–5.1)
Sodium: 138 mmol/L (ref 135–145)

## 2020-09-28 LAB — SEDIMENTATION RATE: Sed Rate: 3 mm/hr (ref 0–22)

## 2020-09-28 LAB — BRAIN NATRIURETIC PEPTIDE: B Natriuretic Peptide: 659.9 pg/mL — ABNORMAL HIGH (ref 0.0–100.0)

## 2020-09-28 LAB — URINE DRUG SCREEN, QUALITATIVE (ARMC ONLY)
Amphetamines, Ur Screen: POSITIVE — AB
Barbiturates, Ur Screen: NOT DETECTED
Benzodiazepine, Ur Scrn: NOT DETECTED
Cannabinoid 50 Ng, Ur ~~LOC~~: NOT DETECTED
Cocaine Metabolite,Ur ~~LOC~~: NOT DETECTED
MDMA (Ecstasy)Ur Screen: NOT DETECTED
Methadone Scn, Ur: NOT DETECTED
Opiate, Ur Screen: NOT DETECTED
Phencyclidine (PCP) Ur S: NOT DETECTED
Tricyclic, Ur Screen: NOT DETECTED

## 2020-09-28 LAB — C-REACTIVE PROTEIN: CRP: 1.5 mg/dL — ABNORMAL HIGH (ref <1.0)

## 2020-09-28 MED ORDER — ACETAMINOPHEN 325 MG PO TABS
650.0000 mg | ORAL_TABLET | ORAL | Status: DC | PRN
  Administered 2020-09-28 – 2020-09-30 (×2): 650 mg via ORAL
  Filled 2020-09-28 (×2): qty 2

## 2020-09-28 MED ORDER — ONDANSETRON HCL 4 MG/2ML IJ SOLN
4.0000 mg | Freq: Four times a day (QID) | INTRAMUSCULAR | Status: DC | PRN
  Administered 2020-09-28 – 2020-09-29 (×3): 4 mg via INTRAVENOUS
  Filled 2020-09-28 (×3): qty 2

## 2020-09-28 MED ORDER — IOHEXOL 350 MG/ML SOLN
75.0000 mL | Freq: Once | INTRAVENOUS | Status: AC | PRN
  Administered 2020-09-28: 75 mL via INTRAVENOUS

## 2020-09-28 MED ORDER — SODIUM CHLORIDE 0.9% FLUSH
3.0000 mL | Freq: Two times a day (BID) | INTRAVENOUS | Status: DC
  Administered 2020-09-28 – 2020-09-29 (×3): 3 mL via INTRAVENOUS

## 2020-09-28 MED ORDER — NITROGLYCERIN 0.4 MG SL SUBL
0.4000 mg | SUBLINGUAL_TABLET | SUBLINGUAL | Status: DC | PRN
  Administered 2020-09-28 (×2): 0.4 mg via SUBLINGUAL
  Filled 2020-09-28: qty 1

## 2020-09-28 MED ORDER — ALPRAZOLAM 0.25 MG PO TABS
0.2500 mg | ORAL_TABLET | Freq: Two times a day (BID) | ORAL | Status: DC | PRN
  Administered 2020-09-29: 0.25 mg via ORAL
  Filled 2020-09-28: qty 1

## 2020-09-28 MED ORDER — NITROGLYCERIN 0.4 MG SL SUBL
0.4000 mg | SUBLINGUAL_TABLET | SUBLINGUAL | Status: DC | PRN
  Administered 2020-09-28: 0.4 mg via SUBLINGUAL
  Filled 2020-09-28: qty 1

## 2020-09-28 MED ORDER — SODIUM CHLORIDE 0.9% FLUSH: 3.0000 mL | INTRAVENOUS | Status: DC | PRN

## 2020-09-28 MED ORDER — NITROGLYCERIN 2 % TD OINT
1.0000 [in_us] | TOPICAL_OINTMENT | Freq: Four times a day (QID) | TRANSDERMAL | Status: DC
  Administered 2020-09-28 – 2020-09-29 (×2): 1 [in_us] via TOPICAL
  Filled 2020-09-28 (×4): qty 1

## 2020-09-28 MED ORDER — FUROSEMIDE 10 MG/ML IJ SOLN
40.0000 mg | Freq: Once | INTRAMUSCULAR | Status: AC
  Administered 2020-09-28: 40 mg via INTRAVENOUS
  Filled 2020-09-28: qty 4

## 2020-09-28 MED ORDER — SODIUM CHLORIDE 0.9 % IV SOLN: 250.0000 mL | INTRAVENOUS | Status: DC | PRN

## 2020-09-28 MED ORDER — METOPROLOL SUCCINATE ER 50 MG PO TB24
25.0000 mg | ORAL_TABLET | Freq: Every day | ORAL | Status: DC
  Administered 2020-09-28 – 2020-09-29 (×2): 25 mg via ORAL
  Filled 2020-09-28 (×2): qty 1

## 2020-09-28 MED ORDER — FUROSEMIDE 10 MG/ML IJ SOLN
40.0000 mg | Freq: Every day | INTRAMUSCULAR | Status: DC
  Administered 2020-09-29: 40 mg via INTRAVENOUS
  Filled 2020-09-28 (×2): qty 4

## 2020-09-28 MED ORDER — ENOXAPARIN SODIUM 40 MG/0.4ML ~~LOC~~ SOLN
40.0000 mg | SUBCUTANEOUS | Status: DC
  Administered 2020-09-28 – 2020-09-29 (×2): 40 mg via SUBCUTANEOUS
  Filled 2020-09-28 (×2): qty 0.4

## 2020-09-28 NOTE — ED Triage Notes (Signed)
Pt to ED sent from PCP for CTA to rule out PE d/t shob for one month worsening yesterday and elevated D Dimer.  Pt speaking in short sentences in triage, slightly tachypneic.  +CP that started a few days ago intermittent, N/V with cough  Tested neg for covid 2 weeks ago  Discussed pt with Dr Charna Archer

## 2020-09-28 NOTE — ED Notes (Signed)
Hold third nitro per dr cox

## 2020-09-28 NOTE — ED Notes (Signed)
hospitalist at bedside

## 2020-09-28 NOTE — ED Notes (Signed)
Pt given lunch tray.

## 2020-09-28 NOTE — ED Notes (Addendum)
Pt lying in bed awake and alert - facial grimacing noted with pt reporting mid-sternal chest pressure rated 7/10 with ongoing sob.  Nitro tab ordered prn administered - bp stable; prior to leaving room pt began to report feeling diaphoretic and nauseated- cold hand cloth provided at pt request and will administer prn Zofran (see MAR) - Ongoing monitoring for acute changes and plan of care to be maintained.

## 2020-09-28 NOTE — Consult Note (Signed)
CARDIOLOGY CONSULT NOTE               Patient ID: Theresa Boyer MRN: [id number] DOB/AGE: 1976-02-11 45 y.o.  Admit date: 09/28/2020 Referring Physician Dr. Duffy Bruce  Primary Physician n/a Primary Cardiologist n/a Reason for Consultation Shortness of breath, elevated troponin   HPI: Theresa Boyer is a 45 year old female with a past medical history significant for asthma, bipolar disorder, and opiate addiction who presented to the ED on 09/28/20 for a 3 week history of progressive dyspnea, a cough, and fatigue.  Workup thus far has been significant for an ECG revealing sinus tachycardia with a LBBB, chest xray revealing mild cardiac enlargement with no evidence of pulmonary edema, chest CT revealing no evidence of an acute PE but small pleural effusions with bibasilar interstitial edema, BNP of 659, high sensitivity troponin elevated x 2 at 187 and 180 respectively and COVID-19, drug screen pending.   09/29/19: Theresa Boyer reports a 3 week history of progressive shortness of breath with associated low energy and a dry, non-productive cough.  She underwent 2 COVID-19 tests, both which were negative.  She also admits to a few day history of left-sided stabbing chest pain, worse with certain positions.  She was given Lasix in the ED and shortness of breath and chest pain have both improved.  She denies lower extremity swelling, orthopnea, or PND.  She admits to a few week history of dizziness and lightheadedness but denies syncopal or presyncopal episodes.    Review of systems complete and found to be negative unless listed above     Past Medical History:  Diagnosis Date  . Abnormal Pap smear   . Anxiety   . Asthma   . Bipolar 1 disorder ([place])   . Melanoma [facility])     Past Surgical History:  Procedure Laterality Date  . CESAREAN SECTION     x 2    (Not in a hospital admission)  Social History   Socioeconomic History  . Marital status: Single    Spouse name: Not on file  .  Number of children: Not on file  . Years of education: Not on file  . Highest education level: Not on file  Occupational History  . Not on file  Tobacco Use  . Smoking status: Current Every Day Smoker    Packs/day: 0.50  . Smokeless tobacco: Never Used  Substance and Sexual Activity  . Alcohol use: No  . Drug use: Yes    Comment: Hx heroin use, States none for past 2 yrs. On Methadone currently  . Sexual activity: Not Currently  Other Topics Concern  . Not on file  Social History Narrative  . Not on file   Social Determinants of Health   Financial Resource Strain: Not on file  Food Insecurity: Not on file  Transportation Needs: Not on file  Physical Activity: Not on file  Stress: Not on file  Social Connections: Not on file  Intimate Partner Violence: Not on file    No family history on file.    Review of systems complete and found to be negative unless listed above      PHYSICAL EXAM  General: Well developed, well nourished, in no acute distress HEENT:  Normocephalic and atramatic Neck:  No JVD.  Lungs: Clear bilaterally to auscultation and percussion. Heart: Tachycardic, regular rhythm. Normal S1 and S2 without gallops or murmurs.  Abdomen: Bowel sounds are positive, abdomen soft and non-tender  Msk:  Back normal.  Normal  strength and tone for age. Extremities: No clubbing, cyanosis or edema.   Neuro: Alert and oriented X 3. Psych:  Good affect, responds appropriately  Labs:   Lab Results  Component Value Date   WBC 8.1 09/28/2020   HGB 11.2 (L) 09/28/2020   HCT 35.5 (L) 09/28/2020   MCV 86.4 09/28/2020   PLT 282 09/28/2020    Recent Labs  Lab 09/28/20 0925  NA 138  K 4.0  CL 105  CO2 24  BUN 17  CREATININE 0.81  CALCIUM 8.7*  GLUCOSE 104*   No results found for: CKTOTAL, CKMB, CKMBINDEX, TROPONINI No results found for: CHOL No results found for: HDL No results found for: LDLCALC No results found for: TRIG No results found for: CHOLHDL No  results found for: LDLDIRECT    Radiology: DG Chest 2 View  Result Date: 09/28/2020 CLINICAL DATA:  Chest pain and shortness of breath EXAM: CHEST - 2 VIEW COMPARISON:  None. FINDINGS: There is mild left base atelectasis. Lungs elsewhere clear. Heart is mildly enlarged with pulmonary vascularity normal. No adenopathy. No bone lesions. IMPRESSION: Mild left base atelectasis. Lungs otherwise clear. Mild cardiac enlargement. Electronically Signed   By: Lowella Grip III M.D.   On: 09/28/2020 10:12   CT Angio Chest PE W/Cm &/Or Wo Cm  Result Date: 09/28/2020 CLINICAL DATA:  Shortness of breath with elevated D-dimer study. Chest pain EXAM: CT ANGIOGRAPHY CHEST WITH CONTRAST TECHNIQUE: Multidetector CT imaging of the chest was performed using the standard protocol during bolus administration of intravenous contrast. Multiplanar CT image reconstructions and MIPs were obtained to evaluate the vascular anatomy. CONTRAST:  55m OMNIPAQUE IOHEXOL 350 MG/ML SOLN COMPARISON:  Chest radiograph September 28, 2020 FINDINGS: Cardiovascular: There is no demonstrable pulmonary embolus. There is no thoracic aortic aneurysm. No dissection evident. Note that the contrast bolus in the aorta is not sufficient for potential dissection assessment. Visualized great vessels appear unremarkable. There is no pericardial effusion or pericardial thickening. There is a degree of cardiomegaly. Mediastinum/Nodes: Visualized thyroid appears normal. There are several subcentimeter lymph nodes. There is an apparent prominent subcarinal lymph node measuring 1.8 x 1.5 cm. No esophageal lesions are appreciable. Lungs/Pleura: There are small pleural effusions bilaterally. There is atelectatic change in the inferior lingula and right base regions. There is slight lobular septal thickening in the bases which may represent a mild degree of interstitial edema. No appreciable airspace consolidation. No pneumothorax. Trachea and major bronchial  structures appear patent. Upper Abdomen: There is reflux of contrast into the inferior vena cava and hepatic veins. Visualized upper abdominal structures otherwise appear unremarkable. Musculoskeletal: No blastic or lytic bone lesions. No evident chest wall lesions. Review of the MIP images confirms the above findings. IMPRESSION: 1. No evident pulmonary embolus. No thoracic aortic aneurysm. Contrast bolus in the aorta is not sufficient for potential dissection assessment. No pericardial thickening or pericardial effusion evident. 2. There is a degree of cardiomegaly. There are small pleural effusions bilaterally with suspected bibasilar interstitial edema. Suspect developing congestive heart failure. Areas of atelectatic change noted without airspace consolidation. 3. Enlargement of a subcarinal lymph node of uncertain etiology. Scattered subcentimeter lymph nodes elsewhere. 4. Reflux of contrast into the inferior vena cava and hepatic veins is likely indicative of increased right heart pressure. Electronically Signed   By: WLowella GripIII M.D.   On: 09/28/2020 11:32    EKG: Sinus tachycardia, rate of 104bpm, with a LBBB - no prior ECG available for comparison   ASSESSMENT AND  PLAN:  1.  Acute CHF   -Chest CT revealing mild interstitial edema, consistent with early CHF; BNP elevated at 659   -Has received Lasix 63m injection x 1 with symptomatic improvement; would recommend continuing Lasix 451monce daily for now; further recommendations pending clinical status   -Echocardiogram ordered   -Daily weights, I's and O's recommended   -Sed rate, ESR pending   2.  Elevated troponin   -187, 180 respectively   -Likely demand ischemia in the setting of new onset CHF; low suspicion for ACS   -Echocardiogram ordered; with LBBB on ECG, will likely need an ischemic workup either in an inpatient or outpatient setting - further plan pending echo results and clinical status   3.  LBBB  -No prior ECG  available for comparison   -Echocardiogram ordered  4.  Hypertension/Tachycardia   -Started on metoprolol succinate 2525maily; agree with this   The history, physical exam findings, and plan of care were all discussed with Dr. KenBartholome Billnd all decision making was made in collaboration.   Signed: NicAvie Arenas-C 09/28/2020, 12:00 PM

## 2020-09-28 NOTE — ED Provider Notes (Signed)
[facility] Emergency Department Provider Note  ____________________________________________   Event Date/Time   First MD Initiated Contact with Patient 09/28/20 1028     (approximate)  I have reviewed the triage vital signs and the nursing notes.   HISTORY  Chief Complaint Shortness of Breath    HPI Keyari Logiudice is a 45 y.o. female with history of bipolar disorder, opiate addiction  here with shortness of breath.  The patient states that for the last 3 weeks, she has had progressively worsening shortness of breath, cough, and fatigue.  She states that her symptoms started 3 weeks ago and that she was tested twice for COVID at that time, which was negative.  She did have some subjective URI symptoms but no known fevers at that time.  Since then, she has had progressive worsening shortness of breath, weakness, shortness of breath, and orthopnea.  She had some intermittent leg swelling.  She went to urgent care yesterday and was told to come here for CT angio, states she returns today because she had to take care of things at home.  Denies any chest pain.  She does feel persistently short of breath and this is worse with lying flat as well as exertion.  No alleviating factors.  No recent medication changes.  No personal history of CHF or cardiac disease.       Past Medical History:  Diagnosis Date  . Abnormal Pap smear   . Anxiety   . Asthma   . Bipolar 1 disorder ([place])   . Melanoma [facility])     Patient Active Problem List   Diagnosis Date Noted  . Heart failure ([place]) 09/28/2020  . Opiate addiction ([place]) 06/20/2011  . Cesarean delivery, without mention of indication, delivered, with or without mention of antepartum condition 06/20/2011    Past Surgical History:  Procedure Laterality Date  . CESAREAN SECTION     x 2    Prior to Admission medications   Medication Sig Start Date End Date Taking? Authorizing Provider  Albuterol Sulfate (PROAIR  RESPICLICK) [id number] (90 Base) MCG/ACT AEPB Inhale into the lungs. 04/11/20  Yes [provider]  clonazePAM (KLONOPIN) 1 MG tablet TAKE ONE-HALF TO 1 TABLET BY MOUTH EVERY 6 HOURS AS NEEDED FOR SEVERE ANXIETY 08/11/20  Yes [provider]  divalproex (DEPAKOTE ER) 500 MG 24 hr tablet  03/08/20  Yes [provider]  LATUDA 20 MG TABS tablet Take 20 mg by mouth at bedtime. 04/24/20  Yes [provider]  lithium carbonate (LITHOBID) 300 MG CR tablet Take 300 mg by mouth 2 (two) times daily. 08/22/20  Yes [provider]  venlafaxine XR (EFFEXOR-XR) 75 MG 24 hr capsule Take 75 mg by mouth daily with breakfast. 04/25/20  Yes [provider]  cephALEXin (KEFLEX) 250 MG capsule Take 250 mg by mouth 4 (four) times daily. Client unsure of dose.  Began after office visit on 07-05-11.  Patient not taking: No sig reported    [provider]  methadone (DOLOPHINE) 10 MG/ML solution Take 115 mg by mouth daily.   Patient not taking: Reported on 09/28/2020    [provider]  Oxycodone-Acetaminophen (PERCOCET PO) Take 1 tablet by mouth daily as needed. For pain Patient not taking: Reported on 09/28/2020    [provider]  prenatal vitamin w/FE, FA (PRENATAL 1 + 1) 27-1 MG TABS Take 1 tablet by mouth daily.   Patient not taking: No sig reported    [provider]  Allergies Lamictal [lamotrigine] and Wellbutrin [bupropion hcl]  No family history on file.  Social History Social History   Tobacco Use  . Smoking status: Current Every Day Smoker    Packs/day: 0.50  . Smokeless tobacco: Never Used  Substance Use Topics  . Alcohol use: No  . Drug use: Yes    Comment: Hx heroin use, States none for past 2 yrs. On Methadone currently    Review of Systems  Review of Systems  Constitutional: Negative for fatigue and fever.  HENT: Negative for congestion and sore throat.   Eyes: Negative for visual disturbance.  Respiratory:  Positive for chest tightness and shortness of breath. Negative for cough.   Cardiovascular: Positive for palpitations and leg swelling. Negative for chest pain.  Gastrointestinal: Negative for abdominal pain, diarrhea, nausea and vomiting.  Genitourinary: Negative for flank pain.  Musculoskeletal: Negative for back pain and neck pain.  Skin: Negative for rash and wound.  Neurological: Positive for weakness.  All other systems reviewed and are negative.    ____________________________________________  PHYSICAL EXAM:      VITAL SIGNS: ED Triage Vitals  Enc Vitals Group     BP 09/28/20 0917 (!) 154/109     Pulse Rate 09/28/20 0917 (!) 107     Resp 09/28/20 0917 (!) 22     Temp 09/28/20 0917 97.7 F (36.5 C)     Temp Source 09/28/20 0917 Oral     SpO2 09/28/20 0917 100 %     Weight 09/28/20 0920 178 lb (80.7 kg)     Height 09/28/20 0920 5\' 4"  (1.626 m)     Head Circumference --      Peak Flow --      Pain Score 09/28/20 0919 6     Pain Loc --      Pain Edu? --      Excl. in [place]? --      Physical Exam Vitals and nursing note reviewed.  Constitutional:      General: She is not in acute distress.    Appearance: She is well-developed.  HENT:     Head: Normocephalic and atraumatic.  Eyes:     Conjunctiva/sclera: Conjunctivae normal.  Cardiovascular:     Rate and Rhythm: Regular rhythm. Tachycardia present.     Heart sounds: Normal heart sounds. No murmur heard. No friction rub.  Pulmonary:     Effort: Pulmonary effort is normal. No respiratory distress.     Breath sounds: Examination of the right-lower field reveals rales. Examination of the left-lower field reveals rales. Wheezing and rales present.  Abdominal:     General: There is no distension.     Palpations: Abdomen is soft.     Tenderness: There is no abdominal tenderness.  Musculoskeletal:     Cervical back: Neck supple.  Skin:    General: Skin is warm.     Capillary Refill: Capillary refill takes less than 2  seconds.  Neurological:     Mental Status: She is alert and oriented to person, place, and time.     Motor: No abnormal muscle tone.       ____________________________________________   LABS (all labs ordered are listed, but only abnormal results are displayed)  Labs Reviewed  BASIC METABOLIC PANEL - Abnormal; Notable for the following components:      Result Value   Glucose, Bld 104 (*)    Calcium 8.7 (*)    All other components within normal limits  CBC WITH DIFFERENTIAL/PLATELET - Abnormal;  Notable for the following components:   Hemoglobin 11.2 (*)    HCT 35.5 (*)    All other components within normal limits  BRAIN NATRIURETIC PEPTIDE - Abnormal; Notable for the following components:   B Natriuretic Peptide 659.9 (*)    All other components within normal limits  URINE DRUG SCREEN, QUALITATIVE ([facility] ONLY) - Abnormal; Notable for the following components:   Amphetamines, Ur Screen POSITIVE (*)    All other components within normal limits  TROPONIN I (HIGH SENSITIVITY) - Abnormal; Notable for the following components:   Troponin I (High Sensitivity) 187 (*)    All other components within normal limits  TROPONIN I (HIGH SENSITIVITY) - Abnormal; Notable for the following components:   Troponin I (High Sensitivity) 180 (*)    All other components within normal limits  RESP PANEL BY RT-PCR (FLU A&B, COVID) ARPGX2  CBC  SEDIMENTATION RATE  C-REACTIVE PROTEIN  HIV ANTIBODY (ROUTINE TESTING W REFLEX)  TSH  BASIC METABOLIC PANEL    ____________________________________________  EKG: Sinus tachycardia, ventricular rate 104.  PR 140, QRS 146, QTc 528.  Left bundle branch block noted.  No old tracings to compare ________________________________________  RADIOLOGY All imaging, including plain films, CT scans, and ultrasounds, independently reviewed by me, and interpretations confirmed via formal radiology reads.  ED MD interpretation:   Chest x-ray: Mild cardiac  enlargement CT angio: Findings concerning for CHF, no PE  Official radiology report(s): DG Chest 2 View  Result Date: 09/28/2020 CLINICAL DATA:  Chest pain and shortness of breath EXAM: CHEST - 2 VIEW COMPARISON:  None. FINDINGS: There is mild left base atelectasis. Lungs elsewhere clear. Heart is mildly enlarged with pulmonary vascularity normal. No adenopathy. No bone lesions. IMPRESSION: Mild left base atelectasis. Lungs otherwise clear. Mild cardiac enlargement. Electronically Signed   By: Lowella Grip III M.D.   On: 09/28/2020 10:12   CT Angio Chest PE W/Cm &/Or Wo Cm  Result Date: 09/28/2020 CLINICAL DATA:  Shortness of breath with elevated D-dimer study. Chest pain EXAM: CT ANGIOGRAPHY CHEST WITH CONTRAST TECHNIQUE: Multidetector CT imaging of the chest was performed using the standard protocol during bolus administration of intravenous contrast. Multiplanar CT image reconstructions and MIPs were obtained to evaluate the vascular anatomy. CONTRAST:  33mL OMNIPAQUE IOHEXOL 350 MG/ML SOLN COMPARISON:  Chest radiograph September 28, 2020 FINDINGS: Cardiovascular: There is no demonstrable pulmonary embolus. There is no thoracic aortic aneurysm. No dissection evident. Note that the contrast bolus in the aorta is not sufficient for potential dissection assessment. Visualized great vessels appear unremarkable. There is no pericardial effusion or pericardial thickening. There is a degree of cardiomegaly. Mediastinum/Nodes: Visualized thyroid appears normal. There are several subcentimeter lymph nodes. There is an apparent prominent subcarinal lymph node measuring 1.8 x 1.5 cm. No esophageal lesions are appreciable. Lungs/Pleura: There are small pleural effusions bilaterally. There is atelectatic change in the inferior lingula and right base regions. There is slight lobular septal thickening in the bases which may represent a mild degree of interstitial edema. No appreciable airspace consolidation. No  pneumothorax. Trachea and major bronchial structures appear patent. Upper Abdomen: There is reflux of contrast into the inferior vena cava and hepatic veins. Visualized upper abdominal structures otherwise appear unremarkable. Musculoskeletal: No blastic or lytic bone lesions. No evident chest wall lesions. Review of the MIP images confirms the above findings. IMPRESSION: 1. No evident pulmonary embolus. No thoracic aortic aneurysm. Contrast bolus in the aorta is not sufficient for potential dissection assessment. No pericardial thickening or  pericardial effusion evident. 2. There is a degree of cardiomegaly. There are small pleural effusions bilaterally with suspected bibasilar interstitial edema. Suspect developing congestive heart failure. Areas of atelectatic change noted without airspace consolidation. 3. Enlargement of a subcarinal lymph node of uncertain etiology. Scattered subcentimeter lymph nodes elsewhere. 4. Reflux of contrast into the inferior vena cava and hepatic veins is likely indicative of increased right heart pressure. Electronically Signed   By: Lowella Grip III M.D.   On: 09/28/2020 11:32    ____________________________________________  PROCEDURES   Procedure(s) performed (including Critical Care):  .Critical Care Performed by: Duffy Bruce, MD Authorized by: Duffy Bruce, MD   Critical care provider statement:    Critical care time (minutes):  35   Critical care time was exclusive of:  Separately billable procedures and treating other patients and teaching time   Critical care was necessary to treat or prevent imminent or life-threatening deterioration of the following conditions:  Cardiac failure, circulatory failure and respiratory failure   Critical care was time spent personally by me on the following activities:  Development of treatment plan with patient or surrogate, discussions with consultants, evaluation of patient's response to treatment, examination of  patient, obtaining history from patient or surrogate, ordering and performing treatments and interventions, ordering and review of laboratory studies, ordering and review of radiographic studies, pulse oximetry, re-evaluation of patient's condition and review of old charts   I assumed direction of critical care for this patient from another provider in my specialty: no      ____________________________________________  INITIAL IMPRESSION / MDM / [place] / ED COURSE  As part of my medical decision making, I reviewed the following data within the [place] notes reviewed and incorporated, Old chart reviewed, Notes from prior ED visits, and [place] Controlled Substance Database       *Aleeya Veitch was evaluated in Emergency Department on 09/28/2020 for the symptoms described in the history of present illness. She was evaluated in the context of the global COVID-19 pandemic, which necessitated consideration that the patient might be at risk for infection with the SARS-CoV-2 virus that causes COVID-19. Institutional protocols and algorithms that pertain to the evaluation of patients at risk for COVID-19 are in a state of rapid change based on information released by regulatory bodies including the CDC and federal and state organizations. These policies and algorithms were followed during the patient's care in the ED.  Some ED evaluations and interventions may be delayed as a result of limited staffing during the pandemic.*     Medical Decision Making:  45 yo F here with SOB, dyspnea on exertion. Pt dyspneic, tachycardic on arrival. EKG shows LBBB, sinus tachycardia. No ST elevations. Initial DDx includes PE, CHF, myocarditis. CT Angio obtained and subsequently shows no evidence of PE, but does show CHF and cardiomegaly. Trop, BNP elevated c/w acute CHF with possible demand. BP markedly elevated.  Suspect HTN-related CM with CHF, versus drug-induced or post-viral  CM/myocarditis. Dr. Ubaldo Glassing of Cardiology consulted. Will admit to medicine. IV lasix, nitro given.  ____________________________________________  FINAL CLINICAL IMPRESSION(S) / ED DIAGNOSES  Final diagnoses:  Acute congestive heart failure, unspecified heart failure type (HCC)  Elevated troponin  Elevated brain natriuretic peptide (BNP) level     MEDICATIONS GIVEN DURING THIS VISIT:  Medications  sodium chloride flush (NS) 0.9 % injection 3 mL (3 mLs Intravenous Not Given 09/28/20 1256)  sodium chloride flush (NS) 0.9 % injection 3 mL (has no administration in  time range)  0.9 %  sodium chloride infusion (has no administration in time range)  acetaminophen (TYLENOL) tablet 650 mg (650 mg Oral Given 09/28/20 1751)  ondansetron (ZOFRAN) injection 4 mg (has no administration in time range)  enoxaparin (LOVENOX) injection 40 mg (has no administration in time range)  furosemide (LASIX) injection 40 mg (has no administration in time range)  ALPRAZolam (XANAX) tablet 0.25 mg (has no administration in time range)  metoprolol succinate (TOPROL-XL) 24 hr tablet 25 mg (25 mg Oral Given 09/28/20 1358)  nitroGLYCERIN (NITROGLYN) 2 % ointment 1 inch (1 inch Topical Given 09/28/20 1544)  nitroGLYCERIN (NITROSTAT) SL tablet 0.4 mg (0.4 mg Sublingual Given 09/28/20 1958)  iohexol (OMNIPAQUE) 350 MG/ML injection 75 mL (75 mLs Intravenous Contrast Given 09/28/20 1109)  furosemide (LASIX) injection 40 mg (40 mg Intravenous Given 09/28/20 1154)     ED Discharge Orders    None       Note:  This document was prepared using Dragon voice recognition software and may include unintentional dictation errors.   Duffy Bruce, MD 09/28/20 2019

## 2020-09-28 NOTE — H&P (Signed)
History and Physical   Theresa Boyer OVF:[id number] DOB: 06/09/1976 DOA: 09/28/2020  PCP: Dr. Nicanor Bake in [place]  Patient coming from: home    I have personally briefly reviewed patient's old medical records in [place].  Chief Concern: shortness of breath  HPI: Theresa Boyer is a 45 y.o. female with medical history significant for history of opioid abuse, tobacco abuse, bipolar disorder, presented to the emergency department for chief concerns of shortness of breath from [place] [facility].  She reports the shortness of breath about 1 month.    She felt cold and flu symptoms about 1 month ago and tested negative for covid via two pcr test. She states that throughout the month she felt her chest getting tighter and tighter and thought it was bronchitis. She states she noticed the shortness of breath about a month ago when you were doing something. And she reprots the shortness of breath is present at rest. She reports she has never felt symptoms like this before. She denies fever in the last month. She denies pitting edema in her legs and endorses enlargement of both her legs. She felt bottom of her feet like it's really sore.   She endorses downward stabbing pain in her left chest. At its peak, the pain is 6/10, lasting minutes, and randomly goes away. This started in the last week.   Patient reports nothing made it better in the past month including rest, inhaler, tylenol, advil. She reports that nitroglycerin and likely lasix helped improved her symptoms.   Social history: lives with finance, Theresa Boyer, her daughters and Fiance's daughter at home. She endorses tobacco use and currently is smoking 5 cigarettes per day (at peak, smoked 2 ppd). She denies etoh. She denies recreational drug use.   Allergies: lamictal causes rash. No lifethreating allergies  ROS: Constitutional: + weight change (weight gain, about 25-30 pounds in the last year), no fever ENT/Mouth: no sore throat,  + rhinorrhea Eyes: no eye pain, no vision changes Cardiovascular: + chest pain, + dyspnea,  no edema, no palpitations Respiratory: + cough, white sputum in last two days, no wheezing Gastrointestinal: no nausea, + vomiting from coughing spasm bile color , no diarrhea, no constipation Genitourinary: no urinary incontinence, no dysuria, no hematuria Musculoskeletal: + arthralgias, + myalgias Skin: no skin lesions, no pruritus, Neuro: + weakness, no loss of consciousness, no syncope Psych: + anxiety, + depression, + decrease appetite, denies HI/SI Heme/Lymph: no bruising, no bleeding  ED Course: Discussed with ED provider patient requiring hospitalization for shortness of breath suspected secondary to new diagnosis of heart failure.   Assessment/Plan  Active Problems:   Heart failure (HCC)   Shortness of breath, suspect secondary to new heart failure with possible myocarditis or poorly controlled hypertension Elevated troponin - Low clinical suspicion for ACS at this time, suspect elevated troponin is due to demand ischemia - Cardiology with [facility] clinic following, per ED provider, Dr Ubaldo Glassing will see patient, and they have ordered an echo - Strict I and Os - UDS is pending, patient denies cocaine use and other recreational drug use - Observation with telemetry  Hypertension - elevated, currently not on antihypertensive  Tobacco use - patient is ready to quit and is currently smoking 5 cigarettes per day which is decreased from 2 packs per day   Chart reviewed.   DVT prophylaxis: enoxaparin subcutaneous Code Status: Full code  Diet: heart healthy Family Communication: updated fiance, Theresa Boyer, at bedside Disposition Plan: pending clinical course Consults called: cardiology,  Dr. Ubaldo Glassing per ED provider Admission status: observation with telemetry  Past Medical History:  Diagnosis Date  . Abnormal Pap smear   . Anxiety   . Asthma   . Bipolar 1 disorder ([place])   . Melanoma [facility])    Past  Surgical History:  Procedure Laterality Date  . CESAREAN SECTION     x 2   Social History:  reports that she has been smoking. She has been smoking about 0.50 packs per day. She has never used smokeless tobacco. She reports current drug use. She reports that she does not drink alcohol.  Allergies  Allergen Reactions  . Lamictal [Lamotrigine] Anaphylaxis  . Lithium Anaphylaxis  . Wellbutrin [Bupropion Hcl] Other (See Comments)    Causes rage    No family history on file. Family history: Family history reviewed and not pertinent  Prior to Admission medications   Medication Sig Start Date End Date Taking? Authorizing Provider  cephALEXin (KEFLEX) 250 MG capsule Take 250 mg by mouth 4 (four) times daily. Client unsure of dose.  Began after office visit on 07-05-11.     [provider]  methadone (DOLOPHINE) 10 MG/ML solution Take 115 mg by mouth daily.      [provider]  Oxycodone-Acetaminophen (PERCOCET PO) Take 1 tablet by mouth daily as needed. For pain    [provider]  prenatal vitamin w/FE, FA (PRENATAL 1 + 1) 27-1 MG TABS Take 1 tablet by mouth daily.      [provider]   Physical Exam: Vitals:   09/28/20 1148 09/28/20 1149 09/28/20 1221 09/28/20 1225  BP: (!) 173/121  (!) 156/119 (!) 158/119  Pulse: (!) 112 (!) 111 (!) 108 (!) 106  Resp: (!) 21 (!) 29 (!) 27 (!) 29  Temp:      TempSrc:      SpO2: 99% 99% 100% 97%  Weight:      Height:       Constitutional: appears age appropriate, NAD, calm, comfortable Eyes: PERRL, lids and conjunctivae normal ENMT: Mucous membranes are moist. Posterior pharynx clear of any exudate or lesions. Age-appropriate dentition. Hearing appropriate Neck: normal, supple, no masses, no thyromegaly Respiratory: clear to auscultation bilaterally, no wheezing, no crackles. Normal respiratory effort. No accessory muscle use.  Cardiovascular: Regular rate and rhythm, no murmurs / rubs / gallops. No extremity  edema. 2+ pedal pulses. No carotid bruits.  Abdomen: obese abdomen, no tenderness, no masses palpated, no hepatosplenomegaly. Bowel sounds positive.  Musculoskeletal: no clubbing / cyanosis. No joint deformity upper and lower extremities. Good ROM, no contractures, no atrophy. Normal muscle tone.  Skin: bilateral upper extremities tattoos that appear chronic, no rashes, lesions, ulcers. No induration Neurologic: Sensation intact. Strength 5/5 in all 4.  Psychiatric: Normal judgment and insight. Alert and oriented x 3. Normal mood.   EKG: independently reviewed, showing LBBB rate of 104, QTc 528  Chest x-ray on Admission: I personally reviewed and I agree with radiologist reading as below.  DG Chest 2 View  Result Date: 09/28/2020 CLINICAL DATA:  Chest pain and shortness of breath EXAM: CHEST - 2 VIEW COMPARISON:  None. FINDINGS: There is mild left base atelectasis. Lungs elsewhere clear. Heart is mildly enlarged with pulmonary vascularity normal. No adenopathy. No bone lesions. IMPRESSION: Mild left base atelectasis. Lungs otherwise clear. Mild cardiac enlargement. Electronically Signed   By: Lowella Grip III M.D.   On: 09/28/2020 10:12   CT Angio Chest PE W/Cm &/Or Wo Cm  Result Date: 09/28/2020  CLINICAL DATA:  Shortness of breath with elevated D-dimer study. Chest pain EXAM: CT ANGIOGRAPHY CHEST WITH CONTRAST TECHNIQUE: Multidetector CT imaging of the chest was performed using the standard protocol during bolus administration of intravenous contrast. Multiplanar CT image reconstructions and MIPs were obtained to evaluate the vascular anatomy. CONTRAST:  66mL OMNIPAQUE IOHEXOL 350 MG/ML SOLN COMPARISON:  Chest radiograph September 28, 2020 FINDINGS: Cardiovascular: There is no demonstrable pulmonary embolus. There is no thoracic aortic aneurysm. No dissection evident. Note that the contrast bolus in the aorta is not sufficient for potential dissection assessment. Visualized great vessels appear  unremarkable. There is no pericardial effusion or pericardial thickening. There is a degree of cardiomegaly. Mediastinum/Nodes: Visualized thyroid appears normal. There are several subcentimeter lymph nodes. There is an apparent prominent subcarinal lymph node measuring 1.8 x 1.5 cm. No esophageal lesions are appreciable. Lungs/Pleura: There are small pleural effusions bilaterally. There is atelectatic change in the inferior lingula and right base regions. There is slight lobular septal thickening in the bases which may represent a mild degree of interstitial edema. No appreciable airspace consolidation. No pneumothorax. Trachea and major bronchial structures appear patent. Upper Abdomen: There is reflux of contrast into the inferior vena cava and hepatic veins. Visualized upper abdominal structures otherwise appear unremarkable. Musculoskeletal: No blastic or lytic bone lesions. No evident chest wall lesions. Review of the MIP images confirms the above findings. IMPRESSION: 1. No evident pulmonary embolus. No thoracic aortic aneurysm. Contrast bolus in the aorta is not sufficient for potential dissection assessment. No pericardial thickening or pericardial effusion evident. 2. There is a degree of cardiomegaly. There are small pleural effusions bilaterally with suspected bibasilar interstitial edema. Suspect developing congestive heart failure. Areas of atelectatic change noted without airspace consolidation. 3. Enlargement of a subcarinal lymph node of uncertain etiology. Scattered subcentimeter lymph nodes elsewhere. 4. Reflux of contrast into the inferior vena cava and hepatic veins is likely indicative of increased right heart pressure. Electronically Signed   By: Lowella Grip III M.D.   On: 09/28/2020 11:32   Labs on Admission: I have personally reviewed following labs  CBC: Recent Labs  Lab 09/28/20 0925  WBC 8.1  NEUTROABS 5.7  HGB 11.2*  HCT 35.5*  MCV 86.4  PLT 510   Basic Metabolic  Panel: Recent Labs  Lab 09/28/20 0925  NA 138  K 4.0  CL 105  CO2 24  GLUCOSE 104*  BUN 17  CREATININE 0.81  CALCIUM 8.7*   Urine analysis:    Component Value Date/Time   COLORURINE YELLOW 07/06/2011 2115   APPEARANCEUR CLEAR 07/06/2011 2115   LABSPEC 1.025 07/06/2011 2115   PHURINE 7.0 07/06/2011 2115   GLUCOSEU NEGATIVE 07/06/2011 2115   HGBUR MODERATE (A) 07/06/2011 2115   BILIRUBINUR NEGATIVE 07/06/2011 2115   KETONESUR NEGATIVE 07/06/2011 2115   PROTEINUR NEGATIVE 07/06/2011 2115   UROBILINOGEN 0.2 07/06/2011 2115   NITRITE NEGATIVE 07/06/2011 2115   LEUKOCYTESUR SMALL (A) 07/06/2011 2115   Daylin Gruszka N Revia Nghiem D.O. [facility]  If 7PM-7AM, please contact overnight-coverage provider If 7AM-7PM, please contact day coverage provider [email or web address]  09/28/2020, 12:33 PM

## 2020-09-28 NOTE — ED Notes (Signed)
Critical trop reported to Dr Ellender Hose

## 2020-09-28 NOTE — ED Notes (Signed)
Pt states Nitro SL tab only helped pain "a little bit" - pain level down from 7/10 to 5/10 - facial grimacing continues and now soft moaning noted.

## 2020-09-29 ENCOUNTER — Observation Stay
Admit: 2020-09-29 | Discharge: 2020-09-29 | Disposition: A | Discharge status: Home / Self Care | Payer: Medicare Other | Attending: Internal Medicine | Admitting: Internal Medicine

## 2020-09-29 ENCOUNTER — Other Ambulatory Visit: Payer: Self-pay

## 2020-09-29 DIAGNOSIS — Z6830 Body mass index (BMI) 30.0-30.9, adult: Secondary | ICD-10-CM | POA: Diagnosis not present

## 2020-09-29 DIAGNOSIS — I739 Peripheral vascular disease, unspecified: Secondary | ICD-10-CM | POA: Diagnosis present

## 2020-09-29 DIAGNOSIS — I447 Left bundle-branch block, unspecified: Secondary | ICD-10-CM | POA: Diagnosis present

## 2020-09-29 DIAGNOSIS — Z79899 Other long term (current) drug therapy: Secondary | ICD-10-CM | POA: Diagnosis not present

## 2020-09-29 DIAGNOSIS — I5021 Acute systolic (congestive) heart failure: Secondary | ICD-10-CM | POA: Diagnosis present

## 2020-09-29 DIAGNOSIS — E669 Obesity, unspecified: Secondary | ICD-10-CM | POA: Diagnosis present

## 2020-09-29 DIAGNOSIS — E876 Hypokalemia: Secondary | ICD-10-CM | POA: Diagnosis present

## 2020-09-29 DIAGNOSIS — F419 Anxiety disorder, unspecified: Secondary | ICD-10-CM | POA: Diagnosis present

## 2020-09-29 DIAGNOSIS — I110 Hypertensive heart disease with heart failure: Secondary | ICD-10-CM | POA: Diagnosis present

## 2020-09-29 DIAGNOSIS — F1120 Opioid dependence, uncomplicated: Secondary | ICD-10-CM | POA: Diagnosis present

## 2020-09-29 DIAGNOSIS — R0600 Dyspnea, unspecified: Secondary | ICD-10-CM

## 2020-09-29 DIAGNOSIS — F17210 Nicotine dependence, cigarettes, uncomplicated: Secondary | ICD-10-CM | POA: Diagnosis present

## 2020-09-29 DIAGNOSIS — F1910 Other psychoactive substance abuse, uncomplicated: Secondary | ICD-10-CM | POA: Diagnosis present

## 2020-09-29 DIAGNOSIS — F1510 Other stimulant abuse, uncomplicated: Secondary | ICD-10-CM | POA: Diagnosis present

## 2020-09-29 DIAGNOSIS — Z720 Tobacco use: Secondary | ICD-10-CM

## 2020-09-29 DIAGNOSIS — F319 Bipolar disorder, unspecified: Secondary | ICD-10-CM

## 2020-09-29 DIAGNOSIS — Z888 Allergy status to other drugs, medicaments and biological substances status: Secondary | ICD-10-CM | POA: Diagnosis not present

## 2020-09-29 DIAGNOSIS — Z85820 Personal history of malignant melanoma of skin: Secondary | ICD-10-CM | POA: Diagnosis not present

## 2020-09-29 DIAGNOSIS — I248 Other forms of acute ischemic heart disease: Secondary | ICD-10-CM | POA: Diagnosis present

## 2020-09-29 DIAGNOSIS — I5022 Chronic systolic (congestive) heart failure: Secondary | ICD-10-CM | POA: Insufficient documentation

## 2020-09-29 DIAGNOSIS — J45909 Unspecified asthma, uncomplicated: Secondary | ICD-10-CM | POA: Diagnosis present

## 2020-09-29 DIAGNOSIS — Z20822 Contact with and (suspected) exposure to covid-19: Secondary | ICD-10-CM | POA: Diagnosis present

## 2020-09-29 LAB — CBC
HCT: 36.1 % (ref 36.0–46.0)
Hemoglobin: 11.6 g/dL — ABNORMAL LOW (ref 12.0–15.0)
MCH: 27.6 pg (ref 26.0–34.0)
MCHC: 32.1 g/dL (ref 30.0–36.0)
MCV: 86 fL (ref 80.0–100.0)
Platelets: 280 10*3/uL (ref 150–400)
RBC: 4.2 MIL/uL (ref 3.87–5.11)
RDW: 15 % (ref 11.5–15.5)
WBC: 6.7 10*3/uL (ref 4.0–10.5)
nRBC: 0 % (ref 0.0–0.2)

## 2020-09-29 LAB — BASIC METABOLIC PANEL
Anion gap: 9 (ref 5–15)
Anion gap: 13 (ref 5–15)
BUN: 22 mg/dL — ABNORMAL HIGH (ref 6–20)
BUN: 24 mg/dL — ABNORMAL HIGH (ref 6–20)
CO2: 25 mmol/L (ref 22–32)
CO2: 28 mmol/L (ref 22–32)
Calcium: 8.6 mg/dL — ABNORMAL LOW (ref 8.9–10.3)
Calcium: 9.1 mg/dL (ref 8.9–10.3)
Chloride: 105 mmol/L (ref 98–111)
Chloride: 98 mmol/L (ref 98–111)
Creatinine, Ser: 1.09 mg/dL — ABNORMAL HIGH (ref 0.44–1.00)
Creatinine, Ser: 1.27 mg/dL — ABNORMAL HIGH (ref 0.44–1.00)
GFR, Estimated: >60 mL/min (ref >60)
GFR, Estimated: 54 mL/min — ABNORMAL LOW (ref >60)
Glucose, Bld: 129 mg/dL — ABNORMAL HIGH (ref 70–99)
Glucose, Bld: 120 mg/dL — ABNORMAL HIGH (ref 70–99)
Potassium: 3.5 mmol/L (ref 3.5–5.1)
Potassium: 3.8 mmol/L (ref 3.5–5.1)
Sodium: 139 mmol/L (ref 135–145)
Sodium: 139 mmol/L (ref 135–145)

## 2020-09-29 LAB — TSH: TSH: 2.378 u[IU]/mL (ref 0.350–4.500)

## 2020-09-29 LAB — ECHOCARDIOGRAM COMPLETE
AR max vel: 1.37 cm2
AV Area VTI: 1.16 cm2
AV Area mean vel: 1.21 cm2
AV Mean grad: 6 mmHg
AV Peak grad: 9.2 mmHg
Ao pk vel: 1.52 m/s
Area-P 1/2: 6.54 cm2
Calc EF: 32.6 %
Height: 64 in
MV VTI: 1.17 cm2
S' Lateral: 5.01 cm
Single Plane A2C EF: 36.4 %
Single Plane A4C EF: 32 %
Weight: 2848 oz

## 2020-09-29 LAB — TROPONIN I (HIGH SENSITIVITY): Troponin I (High Sensitivity): 111 ng/L (ref <18)

## 2020-09-29 MED ORDER — CARVEDILOL 6.25 MG PO TABS
6.2500 mg | ORAL_TABLET | Freq: Two times a day (BID) | ORAL | Status: DC
  Administered 2020-09-29 – 2020-09-30 (×2): 6.25 mg via ORAL
  Filled 2020-09-29 (×2): qty 1

## 2020-09-29 MED ORDER — LISINOPRIL 10 MG PO TABS
10.0000 mg | ORAL_TABLET | Freq: Every day | ORAL | Status: DC
  Administered 2020-09-29 – 2020-09-30 (×2): 10 mg via ORAL
  Filled 2020-09-29 (×2): qty 1

## 2020-09-29 MED ORDER — PERFLUTREN LIPID MICROSPHERE
1.0000 mL | INTRAVENOUS | Status: AC | PRN
  Administered 2020-09-29: 2 mL via INTRAVENOUS
  Filled 2020-09-29: qty 10

## 2020-09-29 NOTE — ED Notes (Signed)
Attempt to collect labs via saline lock and separate straight stick unsuccessful - request lab draw; spoke to [place] at this time -lab will send someone as soon as possible.

## 2020-09-29 NOTE — Progress Notes (Addendum)
PROGRESS NOTE  Theresa Boyer XNA:[id number] DOB: July 14, 1976 DOA: 09/28/2020 PCP: Patient, No Pcp Per  HPI/Recap of past 51 hours: 45 year old female with past medical history of obesity, opioid and tobacco abuse and bipolar disorder who presented to the emergency room with worsening shortness of breath over the past month.  Had a mild nonproductive cough and came to the emergency room on 1/13 when she felt like it was not getting better.  In the emergency room, COVID test negative and patient not hypoxic, but BNP found to be elevated, consistent with new finding of congestive heart failure.  Brought in for further evaluation.  Cardiology consulted.    Echocardiogram today came back noting ejection fraction at 30%.  Cardiology met with patient.  She is currently getting IV Lasix and has diuresed what she thinks is over a liter of fluid already.  She states her breathing is a little bit better.  Urine drug screen came back positive for amphetamines.  Addendum: Around 4 PM, patient started feeling what she describes as weird along with complaints of a headache.  Vital stable.  Oxygen level stable.  Repeat EKG unremarkable.  Lab work pending.  Blood pressure is noted to be within the normal range at 118, however this is the lowest that the patient's blood pressures have been since hospitalization.  Suspect that patient has been running higher blood pressures for the past month and now that she is getting diuresed, started to feel little symptomatic from lower/normalized blood pressure.  Assessment/Plan: Principal Problem:   Acute systolic CHF (congestive heart failure) ([place]): Likely in the context of substance abuse given positive amphetamines.  Advised patient she needs to avoid drugs.  Continue Lasix and beta-blocker and ARB.  Possible candidate for [facility] as outpatient.  Ischemic work-up once fully diuresed. Active Problems:   Opiate addiction [facility]): Patient on methadone.    Bipolar 1 disorder  [facility]): Continue home medications.    Obesity (BMI 30-39.9): Meets criteria BMI greater than 30.   Polysubstance abuse ([place])   Tobacco abuse: Counseled to quit.  Patient states she is ready to do so.   Code Status: Full code  Family Communication: Left message for fianc  Disposition Plan: Anticipate discharge in next 1 to 2 days once fully diuresed.  Can have work-up for ischemia done as outpatient   Consultants:  Cardiology  Procedures:  Echocardiogram done 1/14: Ejection fraction of 30%, preserved diastolic function  Antimicrobials:  None  DVT prophylaxis: Lovenox   Objective: Vitals:   09/29/20 1400 09/29/20 1500  BP: 120/68 94/81  Pulse: 88 100  Resp: (!) 23 13  Temp:    SpO2: 100% 98%   No intake or output data in the 24 hours ending 09/29/20 1559 Filed Weights   09/28/20 0920  Weight: 80.7 kg   Body mass index is 30.55 kg/m.  Exam:   General: Alert and oriented x3, no acute distress  HEENT: Normocephalic, atraumatic, mucous membranes are moist  Cardiovascular: Regular rate and rhythm, S1-S2  Respiratory: Clear to auscultation bilaterally  Abdomen: Soft, nontender, nondistended, positive bowel sounds  Musculoskeletal: No clubbing or cyanosis, trace pitting edema  Skin: No skin breaks, tears or lesions, multiple tattoos on torso and arms  Psychiatry: Appropriate, no evidence of psychoses   Data Reviewed: CBC: Recent Labs  Lab 09/28/20 0925  WBC 8.1  NEUTROABS 5.7  HGB 11.2*  HCT 35.5*  MCV 86.4  PLT 542   Basic Metabolic Panel: Recent Labs  Lab 09/28/20 0925 09/29/20 [id number]  NA 138 139  K 4.0 3.5  CL 105 105  CO2 24 25  GLUCOSE 104* 129*  BUN 17 22*  CREATININE 0.81 1.09*  CALCIUM 8.7* 8.6*   GFR: Estimated Creatinine Clearance: 69.1 mL/min (A) (by C-G formula based on SCr of 1.09 mg/dL (H)). Liver Function Tests: No results for input(s): AST, ALT, ALKPHOS, BILITOT, PROT, ALBUMIN in the last 168 hours. No results for  input(s): LIPASE, AMYLASE in the last 168 hours. No results for input(s): AMMONIA in the last 168 hours. Coagulation Profile: No results for input(s): INR, PROTIME in the last 168 hours. Cardiac Enzymes: No results for input(s): CKTOTAL, CKMB, CKMBINDEX, TROPONINI in the last 168 hours. BNP (last 3 results) No results for input(s): PROBNP in the last 8760 hours. HbA1C: No results for input(s): HGBA1C in the last 72 hours. CBG: No results for input(s): GLUCAP in the last 168 hours. Lipid Profile: No results for input(s): CHOL, HDL, LDLCALC, TRIG, CHOLHDL, LDLDIRECT in the last 72 hours. Thyroid Function Tests: Recent Labs    09/28/20 1252  TSH 2.378   Anemia Panel: No results for input(s): VITAMINB12, FOLATE, FERRITIN, TIBC, IRON, RETICCTPCT in the last 72 hours. Urine analysis:    Component Value Date/Time   COLORURINE YELLOW 07/06/2011 2115   APPEARANCEUR CLEAR 07/06/2011 2115   LABSPEC 1.025 07/06/2011 2115   PHURINE 7.0 07/06/2011 2115   GLUCOSEU NEGATIVE 07/06/2011 2115   HGBUR MODERATE (A) 07/06/2011 2115   BILIRUBINUR NEGATIVE 07/06/2011 2115   KETONESUR NEGATIVE 07/06/2011 2115   PROTEINUR NEGATIVE 07/06/2011 2115   UROBILINOGEN 0.2 07/06/2011 2115   NITRITE NEGATIVE 07/06/2011 2115   LEUKOCYTESUR SMALL (A) 07/06/2011 2115   Sepsis Labs: _0 (procalcitonin:4,lacticidven:4)  ) Recent Results (from the past 240 hour(s))  Resp Panel by RT-PCR (Flu A&B, Covid) Nasopharyngeal Swab     Status: None   Collection Time: 09/28/20 12:21 PM   Specimen: Nasopharyngeal Swab; Nasopharyngeal(NP) swabs in vial transport medium  Result Value Ref Range Status   SARS Coronavirus 2 by RT PCR NEGATIVE NEGATIVE Final    Comment: (NOTE) SARS-CoV-2 target nucleic acids are NOT DETECTED.  The SARS-CoV-2 RNA is generally detectable in upper respiratory specimens during the acute phase of infection. The lowest concentration of SARS-CoV-2 viral copies this assay can detect  is 138 copies/mL. A negative result does not preclude SARS-Cov-2 infection and should not be used as the sole basis for treatment or other patient management decisions. A negative result may occur with  improper specimen collection/handling, submission of specimen other than nasopharyngeal swab, presence of viral mutation(s) within the areas targeted by this assay, and inadequate number of viral copies(<138 copies/mL). A negative result must be combined with clinical observations, patient history, and epidemiological information. The expected result is Negative.  Fact Sheet for Patients:  EntrepreneurPulse.com.au  Fact Sheet for Healthcare Providers:  IncredibleEmployment.be  This test is no t yet approved or cleared by the [place] FDA and  has been authorized for detection and/or diagnosis of SARS-CoV-2 by FDA under an Emergency Use Authorization (EUA). This EUA will remain  in effect (meaning this test can be used) for the duration of the COVID-19 declaration under Section 564(b)(1) of the Act, 21 U.S.C.section 360bbb-3(b)(1), unless the authorization is terminated  or revoked sooner.       Influenza A by PCR NEGATIVE NEGATIVE Final   Influenza B by PCR NEGATIVE NEGATIVE Final    Comment: (NOTE) The Xpert Xpress SARS-CoV-2/FLU/RSV plus assay is intended as an aid in the diagnosis  of influenza from Nasopharyngeal swab specimens and should not be used as a sole basis for treatment. Nasal washings and aspirates are unacceptable for Xpert Xpress SARS-CoV-2/FLU/RSV testing.  Fact Sheet for Patients: EntrepreneurPulse.com.au  Fact Sheet for Healthcare Providers: IncredibleEmployment.be  This test is not yet approved or cleared by the [place] FDA and has been authorized for detection and/or diagnosis of SARS-CoV-2 by FDA under an Emergency Use Authorization (EUA). This EUA will remain in effect  (meaning this test can be used) for the duration of the COVID-19 declaration under Section 564(b)(1) of the Act, 21 U.S.C. section 360bbb-3(b)(1), unless the authorization is terminated or revoked.  Performed at [facility], [address] [place], [place] [address]       Studies: ECHOCARDIOGRAM COMPLETE  Result Date: 09/29/2020    ECHOCARDIOGRAM REPORT   Patient Name:   Theresa Boyer Date of Exam: 09/29/2020 Medical Rec #:  [id number]    Height:       64.0 in Accession #:    [id number]   Weight:       178.0 lb Date of Birth:  1976/07/19   BSA:          1.862 m Patient Age:    34 years     BP:           137/99 mmHg Patient Gender: F            HR:           100 bpm. Exam Location:  [facility] Procedure: 2D Echo, Color Doppler, Cardiac Doppler and Intracardiac            Opacification Agent Indications:     R06.00 Dyspnea  History:         Patient has no prior history of Echocardiogram examinations.                  Arrythmias:LBBB. Asthma.  Sonographer:     Charmayne Sheer RDCS (AE) Referring Phys:  [id number] AMY N COX Diagnosing Phys: Bartholome Bill MD  Sonographer Comments: Suboptimal parasternal window and suboptimal subcostal window. IMPRESSIONS  1. Left ventricular ejection fraction, by estimation, is 30 to 35%. The left ventricle has normal function. The left ventricle demonstrates global hypokinesis. The left ventricular internal cavity size was mildly dilated. Left ventricular diastolic parameters were normal.  2. Right ventricular systolic function is normal. The right ventricular size is normal.  3. The mitral valve is grossly normal. Mild mitral valve regurgitation.  4. The aortic valve is tricuspid. Aortic valve regurgitation is mild. FINDINGS  Left Ventricle: Left ventricular ejection fraction, by estimation, is 30 to 35%. The left ventricle has normal function. The left ventricle demonstrates global hypokinesis. Definity contrast agent was given IV to delineate the left ventricular endocardial  borders. The left ventricular internal cavity size was mildly dilated. There is no left ventricular hypertrophy. Left ventricular diastolic parameters were normal. Right Ventricle: The right ventricular size is normal. No increase in right ventricular wall thickness. Right ventricular systolic function is normal. Left Atrium: Left atrial size was normal in size. Right Atrium: Right atrial size was normal in size. Pericardium: There is no evidence of pericardial effusion. Mitral Valve: The mitral valve is grossly normal. Mild mitral valve regurgitation. MV peak gradient, 8.1 mmHg. The mean mitral valve gradient is 4.0 mmHg. Tricuspid Valve: The tricuspid valve is grossly normal. Tricuspid valve regurgitation is mild. Aortic Valve: The aortic valve is tricuspid. Aortic valve regurgitation is mild. Aortic valve mean gradient measures  6.0 mmHg. Aortic valve peak gradient measures 9.2 mmHg. Aortic valve area, by VTI measures 1.16 cm. Pulmonic Valve: The pulmonic valve was not well visualized. Pulmonic valve regurgitation is trivial. Aorta: The aortic root is normal in size and structure. IAS/Shunts: The interatrial septum was not well visualized.  LEFT VENTRICLE PLAX 2D LVIDd:         5.64 cm      Diastology LVIDs:         5.01 cm      LV e' medial:    7.72 cm/s LV PW:         1.20 cm      LV E/e' medial:  17.6 LV IVS:        0.89 cm      LV e' lateral:   10.30 cm/s LVOT diam:     1.70 cm      LV E/e' lateral: 13.2 LV SV:         29 LV SV Index:   16 LVOT Area:     2.27 cm  LV Volumes (MOD) LV vol d, MOD A2C: 217.0 ml LV vol d, MOD A4C: 181.0 ml LV vol s, MOD A2C: 138.0 ml LV vol s, MOD A4C: 123.0 ml LV SV MOD A2C:     79.0 ml LV SV MOD A4C:     181.0 ml LV SV MOD BP:      64.4 ml RIGHT VENTRICLE RV Basal diam:  3.69 cm RV S prime:     11.90 cm/s LEFT ATRIUM             Index       RIGHT ATRIUM           Index LA diam:        3.80 cm 2.04 cm/m  RA Area:     13.20 cm LA Vol (A2C):   49.2 ml 26.43 ml/m RA Volume:    30.40 ml  16.33 ml/m LA Vol (A4C):   40.7 ml 21.86 ml/m LA Biplane Vol: 46.0 ml 24.71 ml/m  AORTIC VALVE                    PULMONIC VALVE AV Area (Vmax):    1.37 cm     PV Vmax:       0.80 m/s AV Area (Vmean):   1.21 cm     PV Vmean:      54.600 cm/s AV Area (VTI):     1.16 cm     PV VTI:        0.117 m AV Vmax:           152.00 cm/s  PV Peak grad:  2.6 mmHg AV Vmean:          115.000 cm/s PV Mean grad:  1.0 mmHg AV VTI:            0.252 m AV Peak Grad:      9.2 mmHg AV Mean Grad:      6.0 mmHg LVOT Vmax:         92.00 cm/s LVOT Vmean:        61.200 cm/s LVOT VTI:          0.129 m LVOT/AV VTI ratio: 0.51  AORTA Ao Root diam: 2.70 cm MITRAL VALVE                TRICUSPID VALVE MV Area (PHT): 6.54 cm     TR Peak grad:   34.8 mmHg  MV Area VTI:   1.17 cm     TR Vmax:        295.00 cm/s MV Peak grad:  8.1 mmHg MV Mean grad:  4.0 mmHg     SHUNTS MV Vmax:       1.42 m/s     Systemic VTI:  0.13 m MV Vmean:      90.5 cm/s    Systemic Diam: 1.70 cm MV Decel Time: 116 msec MV E velocity: 135.50 cm/s Bartholome Bill MD Electronically signed by Bartholome Bill MD Signature Date/Time: 09/29/2020/12:50:01 PM    Final     Scheduled Meds: . carvedilol  6.25 mg Oral BID WC  . enoxaparin (LOVENOX) injection  40 mg Subcutaneous Q24H  . furosemide  40 mg Intravenous Daily  . lisinopril  10 mg Oral Daily  . nitroGLYCERIN  1 inch Topical Q6H  . sodium chloride flush  3 mL Intravenous Q12H    Continuous Infusions: . sodium chloride       LOS: 0 days     Annita Brod, MD [facility]   09/29/2020, 3:59 PM

## 2020-09-29 NOTE — ED Notes (Signed)
Lab called to obtain due blood samples.

## 2020-09-29 NOTE — Consult Note (Signed)
   Heart Failure Nurse Navigator Note  HFrEF of 30-35% global hypokinesis.  Right ventricular systolic function is normal.  Mild mitral regurgitation.  Mild aortic regurgitation.  She presented to the emergency room with 3-week history of cough, progressive dyspnea and fatigue.  She also noted some intermittent left-sided stabbing chest discomfort.   EKG revealed sinus tachycardia with a left bundle branch block.  Chest CT revealed small pleural effusions with bibasilar interstitial edema.  BNP was 659.  Chest x-ray revealed mild cardiac enlargement.  Co morbidities:    She has a history of opioid abuse, tobacco abuse, and bipolar disorder.      Medications:  Lasix 80 mg daily Lisinopril 10 mg daily Carvedilol 6.25 mg twice daily  To consider Entresto as an outpatient.  Labs:  Sodium 139 potassium 3.5, chloride 105, CO2 25, BUN 22, creatinine 1.09, CRP 1.5, BNP 659, troponin 180, 187, TSH 2.378.  Urinalysis was positive for amphetamines. Weight is 80.7 kg BMI 30.55 Blood pressure 94/81, to reveal sinus tachycardia with rates in the 90s with left bundle branch block.     Assessment:  Theresa Boyer is laying on a gurney in the ED in no acute distress  HEENT-Franks crease noted in bilateral earlobes, she wears glasses, pupils are equal.   Cardiac- tones of regular rate and rhythm no murmurs or gallops appreciated  Chest-breath sounds are clear to posterior auscultation.  Abdomen-soft rounded nontender  Musculoskeletal there is no lower extremity edema, pedal pulses are palpable at 2+.  Psych-she is pleasant and appropriate makes good eye contact.  Neurologic-speech is clear she moves all extremities without difficulty.     Initial visit with patient in the emergency room.  She had just been seen by Dr. Ubaldo Glassing who had explained to her and her ejection fraction.  She questions if this could be congenital, explained that its either caused by tachycardia, viral or  coronary artery disease.  She voices understanding.  Discussed low-sodium diet, foods to avoid etc. discussed the importance of following fluid restriction.   Also discussed appointment with the heart failure clinic. Written information about the heart failure clinic   She had no further questions at this time.  Pricilla Riffle RN [facility]

## 2020-09-29 NOTE — ED Notes (Addendum)
Lab called to obtain blood work.

## 2020-09-29 NOTE — ED Notes (Signed)
Echo at bedside

## 2020-09-29 NOTE — Progress Notes (Signed)
*  PRELIMINARY RESULTS* Echocardiogram 2D Echocardiogram has been performed.  Theresa Boyer 09/29/2020, 10:08 AM

## 2020-09-29 NOTE — ED Notes (Signed)
Admitting MD at bedside.

## 2020-09-29 NOTE — Progress Notes (Signed)
Patient Name: Nykira Flanery Date of Encounter: 09/29/2020  Hospital Problem List     Active Problems:   Heart failure [facility])    Patient Profile     45 year old female with a past medical history significant for asthma, bipolar disorder, and opiate addiction who presented to the ED on 09/28/20 for a 3 week history of progressive dyspnea, a cough, and fatigue.  Workup thus far has been significant for an ECG revealing sinus tachycardia with a LBBB, chest xray revealing mild cardiac enlargement with no evidence of pulmonary edema, chest CT revealing no evidence of an acute PE but small pleural effusions with bibasilar interstitial edema, BNP of 659, high sensitivity troponin elevated x 2 at 187 and 180 respectively and COVID-19, drug screen pending.  She reports a 3 week history of progressive shortness of breath with associated low energy and a dry, non-productive cough.  She underwent 2 COVID-19 tests, both which were negative.  She also admits to a few day history of left-sided stabbing chest pain, worse with certain positions.  She was given Lasix in the ED and shortness of breath and chest pain have both improved.  She denies lower extremity swelling, orthopnea, or PND.  She admits to a few week history of dizziness and lightheadedness but denies syncopal or presyncopal episodes.   Subjective   Some improvement overnight.  I's and O's not recorded.  Inpatient Medications    . carvedilol  6.25 mg Oral BID WC  . enoxaparin (LOVENOX) injection  40 mg Subcutaneous Q24H  . furosemide  40 mg Intravenous Daily  . lisinopril  10 mg Oral Daily  . nitroGLYCERIN  1 inch Topical Q6H  . sodium chloride flush  3 mL Intravenous Q12H    Vital Signs    Vitals:   09/28/20 2230 09/29/20 0226 09/29/20 0640 09/29/20 0958  BP: (!) 149/100 (!) 147/98 (!) 137/99 140/76  Pulse: 97 62 97 98  Resp: (!) 22 18 (!) 21 18  Temp:   98.1 F (36.7 C)   TempSrc:      SpO2: 99% 98% 100% 100%  Weight:       Height:       No intake or output data in the 24 hours ending 09/29/20 1305 Filed Weights   09/28/20 0920  Weight: 80.7 kg    Physical Exam    GEN: Well nourished, well developed, in no acute distress.  HEENT: normal.  Neck: Supple, no JVD, carotid bruits, or masses. Cardiac: RRR, no murmurs, rubs, or gallops. No clubbing, cyanosis, edema.  Radials/DP/PT 2+ and equal bilaterally.  Respiratory:  Respirations regular and unlabored, clear to auscultation bilaterally. GI: Soft, nontender, nondistended, BS + x 4. MS: no deformity or atrophy. Skin: warm and dry, no rash. Neuro:  Strength and sensation are intact. Psych: Normal affect.  Labs    CBC Recent Labs    09/28/20 0925  WBC 8.1  NEUTROABS 5.7  HGB 11.2*  HCT 35.5*  MCV 86.4  PLT [id number]   Basic Metabolic Panel Recent Labs    09/28/20 0925 09/29/20 0613  NA 138 139  K 4.0 3.5  CL 105 105  CO2 24 25  GLUCOSE 104* 129*  BUN 17 22*  CREATININE 0.81 1.09*  CALCIUM 8.7* 8.6*   Liver Function Tests No results for input(s): AST, ALT, ALKPHOS, BILITOT, PROT, ALBUMIN in the last 72 hours. No results for input(s): LIPASE, AMYLASE in the last 72 hours. Cardiac Enzymes No results for input(s): CKTOTAL,  CKMB, CKMBINDEX, TROPONINI in the last 72 hours. BNP Recent Labs    09/28/20 0924  BNP 659.9*   D-Dimer No results for input(s): DDIMER in the last 72 hours. Hemoglobin A1C No results for input(s): HGBA1C in the last 72 hours. Fasting Lipid Panel No results for input(s): CHOL, HDL, LDLCALC, TRIG, CHOLHDL, LDLDIRECT in the last 72 hours. Thyroid Function Tests Recent Labs    09/28/20 1252  TSH 2.378    Telemetry    Normal sinus rhythm  ECG    Normal sinus rhythm no ischemic    Radiology    DG Chest 2 View  Result Date: 09/28/2020 CLINICAL DATA:  Chest pain and shortness of breath EXAM: CHEST - 2 VIEW COMPARISON:  None. FINDINGS: There is mild left base atelectasis. Lungs elsewhere clear. Heart is  mildly enlarged with pulmonary vascularity normal. No adenopathy. No bone lesions. IMPRESSION: Mild left base atelectasis. Lungs otherwise clear. Mild cardiac enlargement. Electronically Signed   By: Lowella Grip III M.D.   On: 09/28/2020 10:12   CT Angio Chest PE W/Cm &/Or Wo Cm  Result Date: 09/28/2020 CLINICAL DATA:  Shortness of breath with elevated D-dimer study. Chest pain EXAM: CT ANGIOGRAPHY CHEST WITH CONTRAST TECHNIQUE: Multidetector CT imaging of the chest was performed using the standard protocol during bolus administration of intravenous contrast. Multiplanar CT image reconstructions and MIPs were obtained to evaluate the vascular anatomy. CONTRAST:  52mL OMNIPAQUE IOHEXOL 350 MG/ML SOLN COMPARISON:  Chest radiograph September 28, 2020 FINDINGS: Cardiovascular: There is no demonstrable pulmonary embolus. There is no thoracic aortic aneurysm. No dissection evident. Note that the contrast bolus in the aorta is not sufficient for potential dissection assessment. Visualized great vessels appear unremarkable. There is no pericardial effusion or pericardial thickening. There is a degree of cardiomegaly. Mediastinum/Nodes: Visualized thyroid appears normal. There are several subcentimeter lymph nodes. There is an apparent prominent subcarinal lymph node measuring 1.8 x 1.5 cm. No esophageal lesions are appreciable. Lungs/Pleura: There are small pleural effusions bilaterally. There is atelectatic change in the inferior lingula and right base regions. There is slight lobular septal thickening in the bases which may represent a mild degree of interstitial edema. No appreciable airspace consolidation. No pneumothorax. Trachea and major bronchial structures appear patent. Upper Abdomen: There is reflux of contrast into the inferior vena cava and hepatic veins. Visualized upper abdominal structures otherwise appear unremarkable. Musculoskeletal: No blastic or lytic bone lesions. No evident chest wall lesions.  Review of the MIP images confirms the above findings. IMPRESSION: 1. No evident pulmonary embolus. No thoracic aortic aneurysm. Contrast bolus in the aorta is not sufficient for potential dissection assessment. No pericardial thickening or pericardial effusion evident. 2. There is a degree of cardiomegaly. There are small pleural effusions bilaterally with suspected bibasilar interstitial edema. Suspect developing congestive heart failure. Areas of atelectatic change noted without airspace consolidation. 3. Enlargement of a subcarinal lymph node of uncertain etiology. Scattered subcentimeter lymph nodes elsewhere. 4. Reflux of contrast into the inferior vena cava and hepatic veins is likely indicative of increased right heart pressure. Electronically Signed   By: Lowella Grip III M.D.   On: 09/28/2020 11:32   ECHOCARDIOGRAM COMPLETE  Result Date: 09/29/2020    ECHOCARDIOGRAM REPORT   Patient Name:   VIKA BUSKE Date of Exam: 09/29/2020 Medical Rec #:  [id number]    Height:       64.0 in Accession #:    [id number]   Weight:       178.0 lb Date  of Birth:  1976-04-14   BSA:          1.862 m Patient Age:    44 years     BP:           137/99 mmHg Patient Gender: F            HR:           100 bpm. Exam Location:  [facility] Procedure: 2D Echo, Color Doppler, Cardiac Doppler and Intracardiac            Opacification Agent Indications:     R06.00 Dyspnea  History:         Patient has no prior history of Echocardiogram examinations.                  Arrythmias:LBBB. Asthma.  Sonographer:     Charmayne Sheer RDCS (AE) Referring Phys:  [id number] AMY N COX Diagnosing Phys: Bartholome Bill MD  Sonographer Comments: Suboptimal parasternal window and suboptimal subcostal window. IMPRESSIONS  1. Left ventricular ejection fraction, by estimation, is 30 to 35%. The left ventricle has normal function. The left ventricle demonstrates global hypokinesis. The left ventricular internal cavity size was mildly dilated. Left ventricular diastolic  parameters were normal.  2. Right ventricular systolic function is normal. The right ventricular size is normal.  3. The mitral valve is grossly normal. Mild mitral valve regurgitation.  4. The aortic valve is tricuspid. Aortic valve regurgitation is mild. FINDINGS  Left Ventricle: Left ventricular ejection fraction, by estimation, is 30 to 35%. The left ventricle has normal function. The left ventricle demonstrates global hypokinesis. Definity contrast agent was given IV to delineate the left ventricular endocardial borders. The left ventricular internal cavity size was mildly dilated. There is no left ventricular hypertrophy. Left ventricular diastolic parameters were normal. Right Ventricle: The right ventricular size is normal. No increase in right ventricular wall thickness. Right ventricular systolic function is normal. Left Atrium: Left atrial size was normal in size. Right Atrium: Right atrial size was normal in size. Pericardium: There is no evidence of pericardial effusion. Mitral Valve: The mitral valve is grossly normal. Mild mitral valve regurgitation. MV peak gradient, 8.1 mmHg. The mean mitral valve gradient is 4.0 mmHg. Tricuspid Valve: The tricuspid valve is grossly normal. Tricuspid valve regurgitation is mild. Aortic Valve: The aortic valve is tricuspid. Aortic valve regurgitation is mild. Aortic valve mean gradient measures 6.0 mmHg. Aortic valve peak gradient measures 9.2 mmHg. Aortic valve area, by VTI measures 1.16 cm. Pulmonic Valve: The pulmonic valve was not well visualized. Pulmonic valve regurgitation is trivial. Aorta: The aortic root is normal in size and structure. IAS/Shunts: The interatrial septum was not well visualized.  LEFT VENTRICLE PLAX 2D LVIDd:         5.64 cm      Diastology LVIDs:         5.01 cm      LV e' medial:    7.72 cm/s LV PW:         1.20 cm      LV E/e' medial:  17.6 LV IVS:        0.89 cm      LV e' lateral:   10.30 cm/s LVOT diam:     1.70 cm      LV E/e'  lateral: 13.2 LV SV:         29 LV SV Index:   16 LVOT Area:     2.27 cm  LV Volumes (MOD) LV  vol d, MOD A2C: 217.0 ml LV vol d, MOD A4C: 181.0 ml LV vol s, MOD A2C: 138.0 ml LV vol s, MOD A4C: 123.0 ml LV SV MOD A2C:     79.0 ml LV SV MOD A4C:     181.0 ml LV SV MOD BP:      64.4 ml RIGHT VENTRICLE RV Basal diam:  3.69 cm RV S prime:     11.90 cm/s LEFT ATRIUM             Index       RIGHT ATRIUM           Index LA diam:        3.80 cm 2.04 cm/m  RA Area:     13.20 cm LA Vol (A2C):   49.2 ml 26.43 ml/m RA Volume:   30.40 ml  16.33 ml/m LA Vol (A4C):   40.7 ml 21.86 ml/m LA Biplane Vol: 46.0 ml 24.71 ml/m  AORTIC VALVE                    PULMONIC VALVE AV Area (Vmax):    1.37 cm     PV Vmax:       0.80 m/s AV Area (Vmean):   1.21 cm     PV Vmean:      54.600 cm/s AV Area (VTI):     1.16 cm     PV VTI:        0.117 m AV Vmax:           152.00 cm/s  PV Peak grad:  2.6 mmHg AV Vmean:          115.000 cm/s PV Mean grad:  1.0 mmHg AV VTI:            0.252 m AV Peak Grad:      9.2 mmHg AV Mean Grad:      6.0 mmHg LVOT Vmax:         92.00 cm/s LVOT Vmean:        61.200 cm/s LVOT VTI:          0.129 m LVOT/AV VTI ratio: 0.51  AORTA Ao Root diam: 2.70 cm MITRAL VALVE                TRICUSPID VALVE MV Area (PHT): 6.54 cm     TR Peak grad:   34.8 mmHg MV Area VTI:   1.17 cm     TR Vmax:        295.00 cm/s MV Peak grad:  8.1 mmHg MV Mean grad:  4.0 mmHg     SHUNTS MV Vmax:       1.42 m/s     Systemic VTI:  0.13 m MV Vmean:      90.5 cm/s    Systemic Diam: 1.70 cm MV Decel Time: 116 msec MV E velocity: 135.50 cm/s Bartholome Bill MD Electronically signed by Bartholome Bill MD Signature Date/Time: 09/29/2020/12:50:01 PM    Final     Assessment & Plan    1.  Acute CHF              -Chest CT revealing mild interstitial edema, consistent with early CHF; BNP elevated at 659.  Echocardiogram today reveals moderate reduced LV function EF 30 to 35% with global hypokinesis.  Etiology of this is unclear.  This is either  ischemic versus idiopathic. Will need optimization of her CHF meds before consideration for ischemic work-up.  Drug screen positive for  amphetamines.  Excessive tachycardia may also be an etiology.             -Has received Lasix 40mg  injection x 1 with symptomatic improvement; would recommend continuing Lasix 40mg  once daily for now;            -Will add lisinopril at 10 mg daily and change from metoprolol to carvedilol 6.25 mg twice daily and continue with diuresis.  Consideration for Entresto as an outpatient discussed.             -Daily weights, I's and O's recommended    2.  Elevated troponin              -187, 180 respectively              -Likely demand ischemia in the setting of new onset CHF; low suspicion for ACS                3.  LBBB             -No prior ECG available for comparison              4.  Hypertension/Tachycardia              -As per above Will place on lisinopril 10 mg daily and carvedilol 6.25 mg twice daily.  Signed, Javier Docker Cordell Coke MD 09/29/2020, 1:05 PM  Pager: [phone]

## 2020-09-29 NOTE — ED Notes (Signed)
This RN entered room to give pt nightly medications, pt noted to not be on monitor. This RN unable to find monitor, pt states she removed herself from monitor and placed monitor outside of room because it was beeping. This RN attempted to educate patient about importance of cardiac monitor but patient only laughed. Pt placed back on cardiac monitor, BP cuff, and pulse ox.

## 2020-09-29 NOTE — ED Notes (Signed)
Pt ambulatory to the bathroom. Pt given apple juice and graham crackers at this time

## 2020-09-29 NOTE — ED Notes (Signed)
Pt calling and reporting nausea again - will administer IVP Zofran (see MAR)

## 2020-09-29 NOTE — ED Notes (Signed)
Pt c/o nausea. No PRN antiemetic order in place. MD made aware.

## 2020-09-30 DIAGNOSIS — I5021 Acute systolic (congestive) heart failure: Secondary | ICD-10-CM | POA: Diagnosis not present

## 2020-09-30 LAB — BASIC METABOLIC PANEL
Anion gap: 8 (ref 5–15)
BUN: 23 mg/dL — ABNORMAL HIGH (ref 6–20)
CO2: 31 mmol/L (ref 22–32)
Calcium: 9 mg/dL (ref 8.9–10.3)
Chloride: 102 mmol/L (ref 98–111)
Creatinine, Ser: 1.14 mg/dL — ABNORMAL HIGH (ref 0.44–1.00)
GFR, Estimated: >60 mL/min (ref >60)
Glucose, Bld: 106 mg/dL — ABNORMAL HIGH (ref 70–99)
Potassium: 3.4 mmol/L — ABNORMAL LOW (ref 3.5–5.1)
Sodium: 141 mmol/L (ref 135–145)

## 2020-09-30 LAB — HCG, QUANTITATIVE, PREGNANCY: hCG, Beta Chain, Quant, S: <1 m[IU]/mL (ref <5)

## 2020-09-30 LAB — HIV ANTIBODY (ROUTINE TESTING W REFLEX): HIV Screen 4th Generation wRfx: NONREACTIVE

## 2020-09-30 MED ORDER — POTASSIUM CHLORIDE CRYS ER 20 MEQ PO TBCR
40.0000 meq | EXTENDED_RELEASE_TABLET | Freq: Once | ORAL | Status: AC
  Administered 2020-09-30: 40 meq via ORAL
  Filled 2020-09-30: qty 2

## 2020-09-30 MED ORDER — MAGNESIUM OXIDE 400 (241.3 MG) MG PO TABS: 400.0000 mg | ORAL_TABLET | Freq: Every day | ORAL | 0 refills | Status: AC

## 2020-09-30 MED ORDER — FUROSEMIDE 40 MG PO TABS
40.0000 mg | ORAL_TABLET | Freq: Two times a day (BID) | ORAL | Status: DC
  Administered 2020-09-30: 40 mg via ORAL
  Filled 2020-09-30: qty 1

## 2020-09-30 MED ORDER — MAGNESIUM SULFATE 50 % IJ SOLN
2.0000 g | Freq: Once | INTRAMUSCULAR | Status: DC
  Filled 2020-09-30: qty 4

## 2020-09-30 MED ORDER — POTASSIUM CHLORIDE CRYS ER 20 MEQ PO TBCR: 40.0000 meq | EXTENDED_RELEASE_TABLET | Freq: Every day | ORAL | 0 refills | Status: AC

## 2020-09-30 MED ORDER — MAGNESIUM OXIDE 400 (241.3 MG) MG PO TABS
400.0000 mg | ORAL_TABLET | Freq: Every day | ORAL | Status: DC
  Administered 2020-09-30: 400 mg via ORAL

## 2020-09-30 MED ORDER — FUROSEMIDE 40 MG PO TABS: 40.0000 mg | ORAL_TABLET | Freq: Two times a day (BID) | ORAL | 0 refills | Status: AC

## 2020-09-30 MED ORDER — CARVEDILOL 6.25 MG PO TABS: 6.2500 mg | ORAL_TABLET | Freq: Two times a day (BID) | ORAL | 0 refills | Status: AC

## 2020-09-30 MED ORDER — LISINOPRIL 10 MG PO TABS: 10.0000 mg | ORAL_TABLET | Freq: Every day | ORAL | 0 refills | Status: AC

## 2020-09-30 MED ORDER — POTASSIUM CHLORIDE CRYS ER 20 MEQ PO TBCR
40.0000 meq | EXTENDED_RELEASE_TABLET | Freq: Every day | ORAL | Status: DC
  Administered 2020-09-30: 40 meq via ORAL

## 2020-09-30 NOTE — ED Notes (Signed)
Pt signed printed d/c paperwork as topaz frozen.  °

## 2020-09-30 NOTE — Discharge Summary (Signed)
Discharge Summary  Theresa Boyer VCB:[id number] DOB: 1976/05/23  PCP: Patient, No Pcp Per  Admit date: 09/28/2020 Discharge date: 09/30/2020  Time spent: 35 minutes  Recommendations for Outpatient Follow-up:  1. Follow-up with cardiology within a week 2. Follow-up with psychiatry in 1 to 2 weeks 3. Follow-up with your PCP within a week 4. Take your medications as prescribed   Discharge Diagnoses:  Active Hospital Problems   Diagnosis Date Noted  . Acute systolic CHF (congestive heart failure) ([place]) 09/28/2020  . Bipolar 1 disorder ([place])   . Obesity (BMI 30-39.9)   . Polysubstance abuse ([place])   . Tobacco abuse   . Opiate addiction ([place]) 06/20/2011    Resolved Hospital Problems  No resolved problems to display.    Discharge Condition: Stable  Diet recommendation: Resume previous diet  Vitals:   09/30/20 1133 09/30/20 1144  BP: (!) 144/97   Pulse:    Resp:    Temp:  98 F (36.7 C)  SpO2:      History of present illness:  45 year old female with past medical history of obesity, opioid and tobacco abuse and bipolar disorder who presented to the emergency room with worsening shortness of breath over the past month.  Had a mild nonproductive cough and came to the emergency room on 1/13 when she felt like it was not getting better.  In the emergency room, COVID test negative and patient not hypoxic, but BNP found to be elevated, consistent with new finding of congestive heart failure.  Brought in for further evaluation.  Cardiology consulted.    Echocardiogram today came back noting ejection fraction at 30%.  Cardiology met with patient.  She is currently getting IV Lasix and has diuresed what she thinks is over a liter of fluid already.  She states her breathing is a little bit better.  Urine drug screen came back positive for amphetamines.  Addendum: Around 4 PM, patient started feeling what she describes as weird along with complaints of a headache.  Vital stable.   Oxygen level stable.  Repeat EKG unremarkable.  Lab work pending.  Blood pressure is noted to be within the normal range at 118, however this is the lowest that the patient's blood pressures have been since hospitalization.  Suspect that patient has been running higher blood pressures for the past month and now that she is getting diuresed, started to feel little symptomatic from lower/normalized blood pressure.  09/30/20: Patient was seen and examined at her bedside in the ED.  She denies any chest pain, dyspnea or palpitations.  She is eager to go home.  She was seen by cardiology, okay to discharge from a cardiac standpoint.  She will need to follow-up with cardiology within a week and take her medications as instructed.  Will repeat BMP outpatient on 10/04/2020.   Hospital Course:  Principal Problem:   Acute systolic CHF (congestive heart failure) (HCC) Active Problems:   Opiate addiction (HCC)   Bipolar 1 disorder (HCC)   Obesity (BMI 30-39.9)   Polysubstance abuse ([place])   Tobacco abuse  Acute systolic CHF (congestive heart failure) ([place]):  Likely in the context of substance abuse given positive amphetamines on UDS.   Advised patient she needs to avoid drugs.  2D Echo done on 09/29/2020 showing LVEF 30 to 35% left ventricle with global hypokinesis.    Continue Lasix, beta-blocker and ARB.  Follow-up with cardiology within a week  Hypokalemia, secondary to diuresing Serum Potassium 3.4. Repleted orally Continue potassium supplement while on  p.o. diuretics Also on lisinopril which may help her low potassium Added magnesium oxide 400 mg daily x7 days. Repeat BMP on 10/04/2020. Follow-up with PCP within a week   Possible PVD with reduced pulse of Left dorsalis pedis on exam Patient is eager to go home Patient advised to follow up with cardiology within a week  She understands and agrees with plan  Bipolar 1 disorder [facility]):  Continue home medications. Follow-up with your  psychiatrist or PCP within a week  Obesity (BMI 30-39.9):  Meets criteria BMI greater than 30. Recommend weight loss outpatient with regular physical activity and healthy dieting.  Polysubstance abuse ([place]) UDS + Amphetamine on 09/28/20 Polysubstance abuse counseling  Tobacco abuse:  Counseled to quit, counseling done by Dr. [place] Boyer.  Patient states she is ready to do so.    Procedures:  2D echo  Consultations:  Cardiology  Discharge Exam: BP (!) 144/97 (BP Location: Right Wrist)   Pulse 81   Temp 98 F (36.7 C) (Oral)   Resp (!) 22   Ht $R'5\' 4"'eH$  (1.626 m)   Wt 80.7 kg   LMP 09/06/2020 (Exact Date)   SpO2 98%   BMI 30.55 kg/m  . General: 45 y.o. year-old female well developed well nourished in no acute distress.  Alert and oriented x3. . Cardiovascular: Regular rate and rhythm with no rubs or gallops.  No thyromegaly or JVD noted.   Marland Kitchen Respiratory: Clear to auscultation with no wheezes or rales. Good inspiratory effort. . Abdomen: Soft nontender nondistended with normal bowel sounds x4 quadrants. . Musculoskeletal: No lower extremity edema bilaterally. R dorsalis pedis 2/4 pulse but 1/4 pulse from left dorsalis pedis. . Skin: No ulcerative lesions noted or rashes . Psychiatry: Mood is appropriate for condition and setting  Discharge Instructions You were cared for by a hospitalist during your hospital stay. If you have any questions about your discharge medications or the care you received while you were in the hospital after you are discharged, you can call the unit and asked to speak with the hospitalist on call if the hospitalist that took care of you is not available. Once you are discharged, your primary care physician will handle any further medical issues. Please note that NO REFILLS for any discharge medications will be authorized once you are discharged, as it is imperative that you return to your primary care physician (or establish a relationship with a primary  care physician if you do not have one) for your aftercare needs so that they can reassess your need for medications and monitor your lab values.   Allergies as of 09/30/2020      Reactions   Lamictal [lamotrigine] Anaphylaxis   Wellbutrin [bupropion Hcl] Other (See Comments)   Causes rage      Medication List    STOP taking these medications   cephALEXin 250 MG capsule Commonly known as: KEFLEX   methadone 10 MG/ML solution Commonly known as: DOLOPHINE   PERCOCET PO   prenatal vitamin w/FE, FA 27-1 MG Tabs tablet     TAKE these medications   Albuterol Sulfate 108 (90 Base) MCG/ACT Aepb Commonly known as: PROAIR RESPICLICK Inhale into the lungs.   carvedilol 6.25 MG tablet Commonly known as: COREG Take 1 tablet (6.25 mg total) by mouth 2 (two) times daily with a meal.   clonazePAM 1 MG tablet Commonly known as: KLONOPIN TAKE ONE-HALF TO 1 TABLET BY MOUTH EVERY 6 HOURS AS NEEDED FOR SEVERE ANXIETY   divalproex 500 MG 24  hr tablet Commonly known as: DEPAKOTE ER   furosemide 40 MG tablet Commonly known as: LASIX Take 1 tablet (40 mg total) by mouth 2 (two) times daily.   Latuda 20 MG Tabs tablet Generic drug: lurasidone Take 20 mg by mouth at bedtime.   lisinopril 10 MG tablet Commonly known as: ZESTRIL Take 1 tablet (10 mg total) by mouth daily. Start taking on: October 01, 2020   lithium carbonate 300 MG CR tablet Commonly known as: LITHOBID Take 300 mg by mouth 2 (two) times daily.   magnesium oxide 400 (241.3 Mg) MG tablet Commonly known as: MAG-OX Take 1 tablet (400 mg total) by mouth daily for 7 days.   potassium chloride SA 20 MEQ tablet Commonly known as: KLOR-CON Take 2 tablets (40 mEq total) by mouth daily.   venlafaxine XR 75 MG 24 hr capsule Commonly known as: EFFEXOR-XR Take 75 mg by mouth daily with breakfast.      Allergies  Allergen Reactions  . Lamictal [Lamotrigine] Anaphylaxis  . Wellbutrin [Bupropion Hcl] Other (See Comments)     Causes rage     Follow-up Information    Fath, Javier Docker, MD Follow up in 1 week(s).   Specialty: Cardiology Contact information: [place] [address] [phone]                The results of significant diagnostics from this hospitalization (including imaging, microbiology, ancillary and laboratory) are listed below for reference.    Significant Diagnostic Studies: DG Chest 2 View  Result Date: 09/28/2020 CLINICAL DATA:  Chest pain and shortness of breath EXAM: CHEST - 2 VIEW COMPARISON:  None. FINDINGS: There is mild left base atelectasis. Lungs elsewhere clear. Heart is mildly enlarged with pulmonary vascularity normal. No adenopathy. No bone lesions. IMPRESSION: Mild left base atelectasis. Lungs otherwise clear. Mild cardiac enlargement. Electronically Signed   By: Lowella Grip III M.D.   On: 09/28/2020 10:12   CT Angio Chest PE W/Cm &/Or Wo Cm  Result Date: 09/28/2020 CLINICAL DATA:  Shortness of breath with elevated D-dimer study. Chest pain EXAM: CT ANGIOGRAPHY CHEST WITH CONTRAST TECHNIQUE: Multidetector CT imaging of the chest was performed using the standard protocol during bolus administration of intravenous contrast. Multiplanar CT image reconstructions and MIPs were obtained to evaluate the vascular anatomy. CONTRAST:  80mL OMNIPAQUE IOHEXOL 350 MG/ML SOLN COMPARISON:  Chest radiograph September 28, 2020 FINDINGS: Cardiovascular: There is no demonstrable pulmonary embolus. There is no thoracic aortic aneurysm. No dissection evident. Note that the contrast bolus in the aorta is not sufficient for potential dissection assessment. Visualized great vessels appear unremarkable. There is no pericardial effusion or pericardial thickening. There is a degree of cardiomegaly. Mediastinum/Nodes: Visualized thyroid appears normal. There are several subcentimeter lymph nodes. There is an apparent prominent subcarinal lymph node measuring 1.8 x 1.5 cm. No  esophageal lesions are appreciable. Lungs/Pleura: There are small pleural effusions bilaterally. There is atelectatic change in the inferior lingula and right base regions. There is slight lobular septal thickening in the bases which may represent a mild degree of interstitial edema. No appreciable airspace consolidation. No pneumothorax. Trachea and major bronchial structures appear patent. Upper Abdomen: There is reflux of contrast into the inferior vena cava and hepatic veins. Visualized upper abdominal structures otherwise appear unremarkable. Musculoskeletal: No blastic or lytic bone lesions. No evident chest wall lesions. Review of the MIP images confirms the above findings. IMPRESSION: 1. No evident pulmonary embolus. No thoracic aortic aneurysm. Contrast bolus in the  aorta is not sufficient for potential dissection assessment. No pericardial thickening or pericardial effusion evident. 2. There is a degree of cardiomegaly. There are small pleural effusions bilaterally with suspected bibasilar interstitial edema. Suspect developing congestive heart failure. Areas of atelectatic change noted without airspace consolidation. 3. Enlargement of a subcarinal lymph node of uncertain etiology. Scattered subcentimeter lymph nodes elsewhere. 4. Reflux of contrast into the inferior vena cava and hepatic veins is likely indicative of increased right heart pressure. Electronically Signed   By: Lowella Grip III M.D.   On: 09/28/2020 11:32   ECHOCARDIOGRAM COMPLETE  Result Date: 09/29/2020    ECHOCARDIOGRAM REPORT   Patient Name:   Theresa Boyer Date of Exam: 09/29/2020 Medical Rec #:  [id number]    Height:       64.0 in Accession #:    [id number]   Weight:       178.0 lb Date of Birth:  Jan 31, 1976   BSA:          1.862 m Patient Age:    75 years     BP:           137/99 mmHg Patient Gender: F            HR:           100 bpm. Exam Location:  [facility] Procedure: 2D Echo, Color Doppler, Cardiac Doppler and Intracardiac             Opacification Agent Indications:     R06.00 Dyspnea  History:         Patient has no prior history of Echocardiogram examinations.                  Arrythmias:LBBB. Asthma.  Sonographer:     Charmayne Sheer RDCS (AE) Referring Phys:  [id number] AMY N COX Diagnosing Phys: Bartholome Bill MD  Sonographer Comments: Suboptimal parasternal window and suboptimal subcostal window. IMPRESSIONS  1. Left ventricular ejection fraction, by estimation, is 30 to 35%. The left ventricle has normal function. The left ventricle demonstrates global hypokinesis. The left ventricular internal cavity size was mildly dilated. Left ventricular diastolic parameters were normal.  2. Right ventricular systolic function is normal. The right ventricular size is normal.  3. The mitral valve is grossly normal. Mild mitral valve regurgitation.  4. The aortic valve is tricuspid. Aortic valve regurgitation is mild. FINDINGS  Left Ventricle: Left ventricular ejection fraction, by estimation, is 30 to 35%. The left ventricle has normal function. The left ventricle demonstrates global hypokinesis. Definity contrast agent was given IV to delineate the left ventricular endocardial borders. The left ventricular internal cavity size was mildly dilated. There is no left ventricular hypertrophy. Left ventricular diastolic parameters were normal. Right Ventricle: The right ventricular size is normal. No increase in right ventricular wall thickness. Right ventricular systolic function is normal. Left Atrium: Left atrial size was normal in size. Right Atrium: Right atrial size was normal in size. Pericardium: There is no evidence of pericardial effusion. Mitral Valve: The mitral valve is grossly normal. Mild mitral valve regurgitation. MV peak gradient, 8.1 mmHg. The mean mitral valve gradient is 4.0 mmHg. Tricuspid Valve: The tricuspid valve is grossly normal. Tricuspid valve regurgitation is mild. Aortic Valve: The aortic valve is tricuspid. Aortic valve  regurgitation is mild. Aortic valve mean gradient measures 6.0 mmHg. Aortic valve peak gradient measures 9.2 mmHg. Aortic valve area, by VTI measures 1.16 cm. Pulmonic Valve: The pulmonic valve was not well visualized. Pulmonic valve regurgitation is trivial.  Aorta: The aortic root is normal in size and structure. IAS/Shunts: The interatrial septum was not well visualized.  LEFT VENTRICLE PLAX 2D LVIDd:         5.64 cm      Diastology LVIDs:         5.01 cm      LV e' medial:    7.72 cm/s LV PW:         1.20 cm      LV E/e' medial:  17.6 LV IVS:        0.89 cm      LV e' lateral:   10.30 cm/s LVOT diam:     1.70 cm      LV E/e' lateral: 13.2 LV SV:         29 LV SV Index:   16 LVOT Area:     2.27 cm  LV Volumes (MOD) LV vol d, MOD A2C: 217.0 ml LV vol d, MOD A4C: 181.0 ml LV vol s, MOD A2C: 138.0 ml LV vol s, MOD A4C: 123.0 ml LV SV MOD A2C:     79.0 ml LV SV MOD A4C:     181.0 ml LV SV MOD BP:      64.4 ml RIGHT VENTRICLE RV Basal diam:  3.69 cm RV S prime:     11.90 cm/s LEFT ATRIUM             Index       RIGHT ATRIUM           Index LA diam:        3.80 cm 2.04 cm/m  RA Area:     13.20 cm LA Vol (A2C):   49.2 ml 26.43 ml/m RA Volume:   30.40 ml  16.33 ml/m LA Vol (A4C):   40.7 ml 21.86 ml/m LA Biplane Vol: 46.0 ml 24.71 ml/m  AORTIC VALVE                    PULMONIC VALVE AV Area (Vmax):    1.37 cm     PV Vmax:       0.80 m/s AV Area (Vmean):   1.21 cm     PV Vmean:      54.600 cm/s AV Area (VTI):     1.16 cm     PV VTI:        0.117 m AV Vmax:           152.00 cm/s  PV Peak grad:  2.6 mmHg AV Vmean:          115.000 cm/s PV Mean grad:  1.0 mmHg AV VTI:            0.252 m AV Peak Grad:      9.2 mmHg AV Mean Grad:      6.0 mmHg LVOT Vmax:         92.00 cm/s LVOT Vmean:        61.200 cm/s LVOT VTI:          0.129 m LVOT/AV VTI ratio: 0.51  AORTA Ao Root diam: 2.70 cm MITRAL VALVE                TRICUSPID VALVE MV Area (PHT): 6.54 cm     TR Peak grad:   34.8 mmHg MV Area VTI:   1.17 cm     TR Vmax:         295.00 cm/s MV Peak grad:  8.1 mmHg MV Mean grad:  4.0 mmHg     SHUNTS MV Vmax:       1.42 m/s     Systemic VTI:  0.13 m MV Vmean:      90.5 cm/s    Systemic Diam: 1.70 cm MV Decel Time: 116 msec MV E velocity: 135.50 cm/s Bartholome Bill MD Electronically signed by Bartholome Bill MD Signature Date/Time: 09/29/2020/12:50:01 PM    Final     Microbiology: Recent Results (from the past 240 hour(s))  Resp Panel by RT-PCR (Flu A&B, Covid) Nasopharyngeal Swab     Status: None   Collection Time: 09/28/20 12:21 PM   Specimen: Nasopharyngeal Swab; Nasopharyngeal(NP) swabs in vial transport medium  Result Value Ref Range Status   SARS Coronavirus 2 by RT PCR NEGATIVE NEGATIVE Final    Comment: (NOTE) SARS-CoV-2 target nucleic acids are NOT DETECTED.  The SARS-CoV-2 RNA is generally detectable in upper respiratory specimens during the acute phase of infection. The lowest concentration of SARS-CoV-2 viral copies this assay can detect is 138 copies/mL. A negative result does not preclude SARS-Cov-2 infection and should not be used as the sole basis for treatment or other patient management decisions. A negative result may occur with  improper specimen collection/handling, submission of specimen other than nasopharyngeal swab, presence of viral mutation(s) within the areas targeted by this assay, and inadequate number of viral copies(<138 copies/mL). A negative result must be combined with clinical observations, patient history, and epidemiological information. The expected result is Negative.  Fact Sheet for Patients:  EntrepreneurPulse.com.au  Fact Sheet for Healthcare Providers:  IncredibleEmployment.be  This test is no t yet approved or cleared by the [place] FDA and  has been authorized for detection and/or diagnosis of SARS-CoV-2 by FDA under an Emergency Use Authorization (EUA). This EUA will remain  in effect (meaning this test can be used) for the  duration of the COVID-19 declaration under Section 564(b)(1) of the Act, 21 U.S.C.section 360bbb-3(b)(1), unless the authorization is terminated  or revoked sooner.       Influenza A by PCR NEGATIVE NEGATIVE Final   Influenza B by PCR NEGATIVE NEGATIVE Final    Comment: (NOTE) The Xpert Xpress SARS-CoV-2/FLU/RSV plus assay is intended as an aid in the diagnosis of influenza from Nasopharyngeal swab specimens and should not be used as a sole basis for treatment. Nasal washings and aspirates are unacceptable for Xpert Xpress SARS-CoV-2/FLU/RSV testing.  Fact Sheet for Patients: EntrepreneurPulse.com.au  Fact Sheet for Healthcare Providers: IncredibleEmployment.be  This test is not yet approved or cleared by the [place] FDA and has been authorized for detection and/or diagnosis of SARS-CoV-2 by FDA under an Emergency Use Authorization (EUA). This EUA will remain in effect (meaning this test can be used) for the duration of the COVID-19 declaration under Section 564(b)(1) of the Act, 21 U.S.C. section 360bbb-3(b)(1), unless the authorization is terminated or revoked.  Performed at [facility], [address] [place], [place] [address]      Labs: Basic Metabolic Panel: Recent Labs  Lab 09/28/20 0925 09/29/20 0613 09/29/20 1945 09/30/20 0440  NA 138 139 139 141  K 4.0 3.5 3.8 3.4*  CL 105 105 98 102  CO2 $Re'24 25 28 31  'Ylh$ GLUCOSE 104* 129* 120* 106*  BUN 17 22* 24* 23*  CREATININE 0.81 1.09* 1.27* 1.14*  CALCIUM 8.7* 8.6* 9.1 9.0   Liver Function Tests: No results for input(s): AST, ALT, ALKPHOS, BILITOT, PROT, ALBUMIN in the last 168 hours. No results for input(s): LIPASE, AMYLASE in  the last 168 hours. No results for input(s): AMMONIA in the last 168 hours. CBC: Recent Labs  Lab 09/28/20 0925 09/29/20 1945  WBC 8.1 6.7  NEUTROABS 5.7  --   HGB 11.2* 11.6*  HCT 35.5* 36.1  MCV 86.4 86.0  PLT 282 280    Cardiac Enzymes: No results for input(s): CKTOTAL, CKMB, CKMBINDEX, TROPONINI in the last 168 hours. BNP: BNP (last 3 results) Recent Labs    09/28/20 0924  BNP 659.9*    ProBNP (last 3 results) No results for input(s): PROBNP in the last 8760 hours.  CBG: No results for input(s): GLUCAP in the last 168 hours.     Signed:  Kayleen Memos, MD [facility] 09/30/2020, 11:53 AM

## 2020-09-30 NOTE — ED Notes (Signed)
Patient removed from monitor per request so that she can change her clothes and give herself a bed bath.

## 2020-09-30 NOTE — ED Notes (Signed)
Pt agreeable to oral mag.

## 2020-09-30 NOTE — Progress Notes (Signed)
Patient Name: Theresa Boyer Date of Encounter: 09/30/2020  Hospital Problem List     Principal Problem:   Acute systolic CHF (congestive heart failure) ([place]) Active Problems:   Opiate addiction ([place])   Bipolar 1 disorder (H[facility])   Obesity (BMI 30-39.9)   Polysubstance abuse ([place])   Tobacco abuse    Patient Profile     45 year old female with a past medical history significant forasthma,bipolar disorder,and opiate addiction who presented to the ED on 09/28/20 for a 3 week history of progressive dyspnea, a cough, and fatigue. Workup thus far has been significant for an ECG revealing sinus tachycardia with a LBBB, chest xray revealing mild cardiac enlargement with no evidence of pulmonary edema, chest CT revealing no evidence of an acute PE but small pleural effusions with bibasilar interstitial edema, BNP of 659, high sensitivity troponin elevated x 2at 187 and 180 respectivelyand COVID-19, drug screen pending.  She reports a 3 week history of progressive shortness of breath with associated low energy and a dry, non-productive cough. She underwent 2 COVID-19 tests, both which were negative. She also admits to a few day history of left-sided stabbing chest pain, worse with certain positions. She was given Lasix in the ED and shortness of breath and chest pain have both improved. She denies lower extremity swelling, orthopnea, or PND. She admits to a few week history of dizziness and lightheadedness but denies syncopal or presyncopal episodes. Echo showed reduced lv funciton with global hypokinesis.   Subjective   Less sob. Ambulating around room with no difficulty  Inpatient Medications    . carvedilol  6.25 mg Oral BID WC  . furosemide  40 mg Oral BID  . lisinopril  10 mg Oral Daily  . magnesium sulfate  2 g Intravenous Once  . potassium chloride  40 mEq Oral Once  . sodium chloride flush  3 mL Intravenous Q12H    Vital Signs    Vitals:   09/30/20 0400 09/30/20 0430  09/30/20 0815 09/30/20 0830  BP: 102/68 106/67  (!) 123/95  Pulse: 80 77 85 86  Resp: 16 18 17  (!) 21  Temp:      TempSrc:      SpO2: 98% 100% 100% 100%  Weight:      Height:       No intake or output data in the 24 hours ending 09/30/20 0925 Filed Weights   09/28/20 0920  Weight: 80.7 kg    Physical Exam    GEN: Well nourished, well developed, in no acute distress.  HEENT: normal.  Neck: Supple, no JVD, carotid bruits, or masses. Cardiac: RRR, no murmurs, rubs, or gallops. No clubbing, cyanosis, edema.  Radials/DP/PT 2+ and equal bilaterally.  Respiratory:  Respirations regular and unlabored, clear to auscultation bilaterally. GI: Soft, nontender, nondistended, BS + x 4. MS: no deformity or atrophy. Skin: warm and dry, no rash. Neuro:  Strength and sensation are intact. Psych: Normal affect.  Labs    CBC Recent Labs    09/28/20 0925 09/29/20 1945  WBC 8.1 6.7  NEUTROABS 5.7  --   HGB 11.2* 11.6*  HCT 35.5* 36.1  MCV 86.4 86.0  PLT 282 308   Basic Metabolic Panel Recent Labs    09/29/20 1945 09/30/20 0440  NA 139 141  K 3.8 3.4*  CL 98 102  CO2 28 31  GLUCOSE 120* 106*  BUN 24* 23*  CREATININE 1.27* 1.14*  CALCIUM 9.1 9.0   Liver Function Tests No results  for input(s): AST, ALT, ALKPHOS, BILITOT, PROT, ALBUMIN in the last 72 hours. No results for input(s): LIPASE, AMYLASE in the last 72 hours. Cardiac Enzymes No results for input(s): CKTOTAL, CKMB, CKMBINDEX, TROPONINI in the last 72 hours. BNP Recent Labs    09/28/20 0924  BNP 659.9*   D-Dimer No results for input(s): DDIMER in the last 72 hours. Hemoglobin A1C No results for input(s): HGBA1C in the last 72 hours. Fasting Lipid Panel No results for input(s): CHOL, HDL, LDLCALC, TRIG, CHOLHDL, LDLDIRECT in the last 72 hours. Thyroid Function Tests Recent Labs    09/28/20 1252  TSH 2.378    Telemetry    nsr  ECG    nsr  Radiology    DG Chest 2 View  Result Date:  09/28/2020 CLINICAL DATA:  Chest pain and shortness of breath EXAM: CHEST - 2 VIEW COMPARISON:  None. FINDINGS: There is mild left base atelectasis. Lungs elsewhere clear. Heart is mildly enlarged with pulmonary vascularity normal. No adenopathy. No bone lesions. IMPRESSION: Mild left base atelectasis. Lungs otherwise clear. Mild cardiac enlargement. Electronically Signed   By: Lowella Grip III M.D.   On: 09/28/2020 10:12   CT Angio Chest PE W/Cm &/Or Wo Cm  Result Date: 09/28/2020 CLINICAL DATA:  Shortness of breath with elevated D-dimer study. Chest pain EXAM: CT ANGIOGRAPHY CHEST WITH CONTRAST TECHNIQUE: Multidetector CT imaging of the chest was performed using the standard protocol during bolus administration of intravenous contrast. Multiplanar CT image reconstructions and MIPs were obtained to evaluate the vascular anatomy. CONTRAST:  52mL OMNIPAQUE IOHEXOL 350 MG/ML SOLN COMPARISON:  Chest radiograph September 28, 2020 FINDINGS: Cardiovascular: There is no demonstrable pulmonary embolus. There is no thoracic aortic aneurysm. No dissection evident. Note that the contrast bolus in the aorta is not sufficient for potential dissection assessment. Visualized great vessels appear unremarkable. There is no pericardial effusion or pericardial thickening. There is a degree of cardiomegaly. Mediastinum/Nodes: Visualized thyroid appears normal. There are several subcentimeter lymph nodes. There is an apparent prominent subcarinal lymph node measuring 1.8 x 1.5 cm. No esophageal lesions are appreciable. Lungs/Pleura: There are small pleural effusions bilaterally. There is atelectatic change in the inferior lingula and right base regions. There is slight lobular septal thickening in the bases which may represent a mild degree of interstitial edema. No appreciable airspace consolidation. No pneumothorax. Trachea and major bronchial structures appear patent. Upper Abdomen: There is reflux of contrast into the  inferior vena cava and hepatic veins. Visualized upper abdominal structures otherwise appear unremarkable. Musculoskeletal: No blastic or lytic bone lesions. No evident chest wall lesions. Review of the MIP images confirms the above findings. IMPRESSION: 1. No evident pulmonary embolus. No thoracic aortic aneurysm. Contrast bolus in the aorta is not sufficient for potential dissection assessment. No pericardial thickening or pericardial effusion evident. 2. There is a degree of cardiomegaly. There are small pleural effusions bilaterally with suspected bibasilar interstitial edema. Suspect developing congestive heart failure. Areas of atelectatic change noted without airspace consolidation. 3. Enlargement of a subcarinal lymph node of uncertain etiology. Scattered subcentimeter lymph nodes elsewhere. 4. Reflux of contrast into the inferior vena cava and hepatic veins is likely indicative of increased right heart pressure. Electronically Signed   By: Lowella Grip III M.D.   On: 09/28/2020 11:32   ECHOCARDIOGRAM COMPLETE  Result Date: 09/29/2020    ECHOCARDIOGRAM REPORT   Patient Name:   Theresa Boyer Date of Exam: 09/29/2020 Medical Rec #:  [id number]    Height:  64.0 in Accession #:    [id number]   Weight:       178.0 lb Date of Birth:  September 10, 1976   BSA:          1.862 m Patient Age:    23 years     BP:           137/99 mmHg Patient Gender: F            HR:           100 bpm. Exam Location:  [facility] Procedure: 2D Echo, Color Doppler, Cardiac Doppler and Intracardiac            Opacification Agent Indications:     R06.00 Dyspnea  History:         Patient has no prior history of Echocardiogram examinations.                  Arrythmias:LBBB. Asthma.  Sonographer:     Charmayne Sheer RDCS (AE) Referring Phys:  [id number] AMY N COX Diagnosing Phys: Bartholome Bill MD  Sonographer Comments: Suboptimal parasternal window and suboptimal subcostal window. IMPRESSIONS  1. Left ventricular ejection fraction, by estimation, is 30  to 35%. The left ventricle has normal function. The left ventricle demonstrates global hypokinesis. The left ventricular internal cavity size was mildly dilated. Left ventricular diastolic parameters were normal.  2. Right ventricular systolic function is normal. The right ventricular size is normal.  3. The mitral valve is grossly normal. Mild mitral valve regurgitation.  4. The aortic valve is tricuspid. Aortic valve regurgitation is mild. FINDINGS  Left Ventricle: Left ventricular ejection fraction, by estimation, is 30 to 35%. The left ventricle has normal function. The left ventricle demonstrates global hypokinesis. Definity contrast agent was given IV to delineate the left ventricular endocardial borders. The left ventricular internal cavity size was mildly dilated. There is no left ventricular hypertrophy. Left ventricular diastolic parameters were normal. Right Ventricle: The right ventricular size is normal. No increase in right ventricular wall thickness. Right ventricular systolic function is normal. Left Atrium: Left atrial size was normal in size. Right Atrium: Right atrial size was normal in size. Pericardium: There is no evidence of pericardial effusion. Mitral Valve: The mitral valve is grossly normal. Mild mitral valve regurgitation. MV peak gradient, 8.1 mmHg. The mean mitral valve gradient is 4.0 mmHg. Tricuspid Valve: The tricuspid valve is grossly normal. Tricuspid valve regurgitation is mild. Aortic Valve: The aortic valve is tricuspid. Aortic valve regurgitation is mild. Aortic valve mean gradient measures 6.0 mmHg. Aortic valve peak gradient measures 9.2 mmHg. Aortic valve area, by VTI measures 1.16 cm. Pulmonic Valve: The pulmonic valve was not well visualized. Pulmonic valve regurgitation is trivial. Aorta: The aortic root is normal in size and structure. IAS/Shunts: The interatrial septum was not well visualized.  LEFT VENTRICLE PLAX 2D LVIDd:         5.64 cm      Diastology LVIDs:          5.01 cm      LV e' medial:    7.72 cm/s LV PW:         1.20 cm      LV E/e' medial:  17.6 LV IVS:        0.89 cm      LV e' lateral:   10.30 cm/s LVOT diam:     1.70 cm      LV E/e' lateral: 13.2 LV SV:  29 LV SV Index:   16 LVOT Area:     2.27 cm  LV Volumes (MOD) LV vol d, MOD A2C: 217.0 ml LV vol d, MOD A4C: 181.0 ml LV vol s, MOD A2C: 138.0 ml LV vol s, MOD A4C: 123.0 ml LV SV MOD A2C:     79.0 ml LV SV MOD A4C:     181.0 ml LV SV MOD BP:      64.4 ml RIGHT VENTRICLE RV Basal diam:  3.69 cm RV S prime:     11.90 cm/s LEFT ATRIUM             Index       RIGHT ATRIUM           Index LA diam:        3.80 cm 2.04 cm/m  RA Area:     13.20 cm LA Vol (A2C):   49.2 ml 26.43 ml/m RA Volume:   30.40 ml  16.33 ml/m LA Vol (A4C):   40.7 ml 21.86 ml/m LA Biplane Vol: 46.0 ml 24.71 ml/m  AORTIC VALVE                    PULMONIC VALVE AV Area (Vmax):    1.37 cm     PV Vmax:       0.80 m/s AV Area (Vmean):   1.21 cm     PV Vmean:      54.600 cm/s AV Area (VTI):     1.16 cm     PV VTI:        0.117 m AV Vmax:           152.00 cm/s  PV Peak grad:  2.6 mmHg AV Vmean:          115.000 cm/s PV Mean grad:  1.0 mmHg AV VTI:            0.252 m AV Peak Grad:      9.2 mmHg AV Mean Grad:      6.0 mmHg LVOT Vmax:         92.00 cm/s LVOT Vmean:        61.200 cm/s LVOT VTI:          0.129 m LVOT/AV VTI ratio: 0.51  AORTA Ao Root diam: 2.70 cm MITRAL VALVE                TRICUSPID VALVE MV Area (PHT): 6.54 cm     TR Peak grad:   34.8 mmHg MV Area VTI:   1.17 cm     TR Vmax:        295.00 cm/s MV Peak grad:  8.1 mmHg MV Mean grad:  4.0 mmHg     SHUNTS MV Vmax:       1.42 m/s     Systemic VTI:  0.13 m MV Vmean:      90.5 cm/s    Systemic Diam: 1.70 cm MV Decel Time: 116 msec MV E velocity: 135.50 cm/s Bartholome Bill MD Electronically signed by Bartholome Bill MD Signature Date/Time: 09/29/2020/12:50:01 PM    Final     Assessment & Plan    1. Acute CHF  -Chest CT revealing mild interstitial edema, consistent with  early CHF; BNP was elevated at 659.  Echocardiogram today reveals moderate reduced LV function EF 30 to 35% with global hypokinesis.  Etiology of this is unclear.  This is either ischemic versus idiopathic.  She is doing well and appears euvolemic.  Creatinine has stabilized.  Ambulating around her ER room without difficulty. Drug screen positive for amphetamines.  . -We will change her furosemide to 40 mg p.o. twice daily. -Will continue with isinopril 10 mg daily and carvedilol carvedilol 6.25 mg twice daily and continue with oral diuresis.  Consideration for Entresto as an outpatient. -Low-sodium diet and daily weights at home.   2. Elevated troponin  -187, 180 respectively  -Likely demand ischemia in the setting of new onset CHF; low suspicion for ACS.  No evidence of acute coronary syndrome by exam and clinically.  3. LBBB -No prior ECG available for comparison    4. Hypertension/Tachycardia  -As per above.  We will continue with carvedilol 6.25 mg twice daily.  Appears stable for discharge from a cardiac standpoint.  Would discharge on carvedilol 6.25 mg twice daily, lisinopril 10 mg daily, furosemide 40 mg twice daily.  Low-sodium diet.  I will see back in the office in 1 week for follow-up.  We will check renal function at that time and make any further adjustments in her regimen.Joyce Gross Rickelle Sylvestre MD 09/30/2020, 9:25 AM  Pager: [phone]

## 2020-09-30 NOTE — ED Notes (Signed)
Notified provider [place] Crane C that pt not given mag sulf ordered for this morning. Med remains unavailable currently.

## 2020-09-30 NOTE — ED Notes (Signed)
Pt offered mag sulf; refused as desire is to go ahead and leave.

## 2020-09-30 NOTE — ED Notes (Signed)
Says headache from the nitro 6 or 7 out of 10.  md notified. Tylenol given earlier did not help.   She is alert and in nad.  Says she is hungry.

## 2020-10-05 ENCOUNTER — Emergency Department
Admission: EM | Admit: 2020-10-05 | Discharge: 2020-10-05 | Discharge status: Against Medical Advice | Payer: Medicare Other

## 2020-10-05 NOTE — ED Triage Notes (Signed)
Recent discharge from hospital for cardiac issues.  Per EMS patient arrives with c/o right sided chest pain radiating to center.  VS wnl.  ASpirin 325 mg taken at home PTA.

## 2020-10-17 NOTE — Progress Notes (Deleted)
°   Patient ID: Theresa Boyer, female    DOB: 1976-08-26, 45 y.o.   MRN: [id number]  HPI  Ms Gambino is a 45 y/o female with a history of  Echo report from 09/29/20 reviewed and showed an EF of 30-35% along with mild MR.   [place] 10/06/20   Admitted 10/05/20 due to heart failure and chest pain. Cardiology consulted and patient taken to cath lab which showed normal coronaries.               Discharged after 6 days.   Review of Systems    Physical Exam  Assessment & Plan:  1: Chronic heart failure with reduced ejection fraction- - NYHA class  2: Anxiety-

## 2020-10-18 ENCOUNTER — Telehealth: Payer: Self-pay | Admitting: Family

## 2020-10-18 ENCOUNTER — Ambulatory Visit: Payer: Medicare Other | Admitting: Family

## 2020-10-18 NOTE — Telephone Encounter (Signed)
Patient did not show for her Heart Failure Clinic appointment on 10/18/20. Will attempt to reschedule.

## 2020-10-23 ENCOUNTER — Telehealth: Payer: Self-pay | Admitting: Family

## 2020-10-23 NOTE — Telephone Encounter (Signed)
LVM in attempt to reschedule a no show missed new patient CHF Clinic appointment.   Carlis Blanchard, NT

## 2022-05-31 ENCOUNTER — Ambulatory Visit: Payer: Medicare Other | Admitting: Internal Medicine

## 2022-06-01 IMAGING — CT CT ANGIO CHEST
2 of 6 series · 18 of 46 positions shown · IV contrast (APPLIED)
Comparison: Chest radiograph September 28, 2020

CLINICAL DATA: Shortness of breath with elevated D-dimer study.
Chest pain

EXAM:
CT ANGIOGRAPHY CHEST WITH CONTRAST
TECHNIQUE: Multidetector CT imaging of the chest was performed using the
standard protocol during bolus administration of intravenous
contrast. Multiplanar CT image reconstructions and MIPs were
obtained to evaluate the vascular anatomy.
CONTRAST:  75mL OMNIPAQUE IOHEXOL 350 MG/ML SOLN

[Series 5: thins · axial · 0.64mm/px · z∈[-203,+75]mm · 16 of 306 slices shown]
[im 14/306  lung]
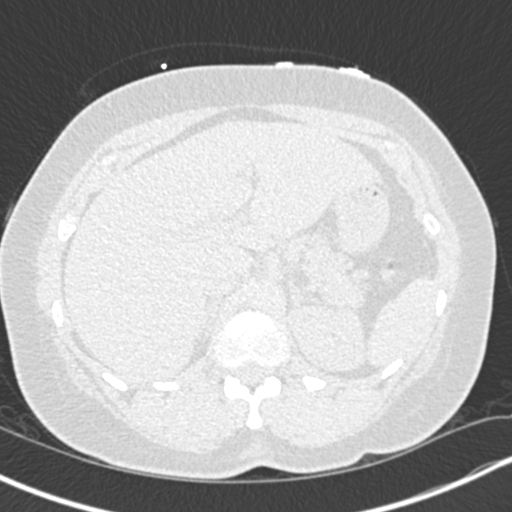
[im 40/306  soft-tissue]
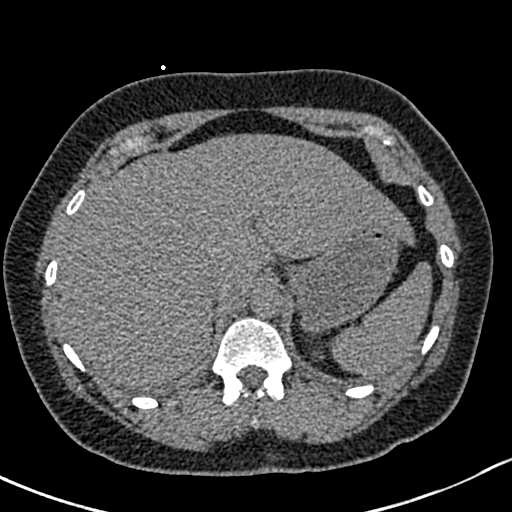
[im 54/306  lung]
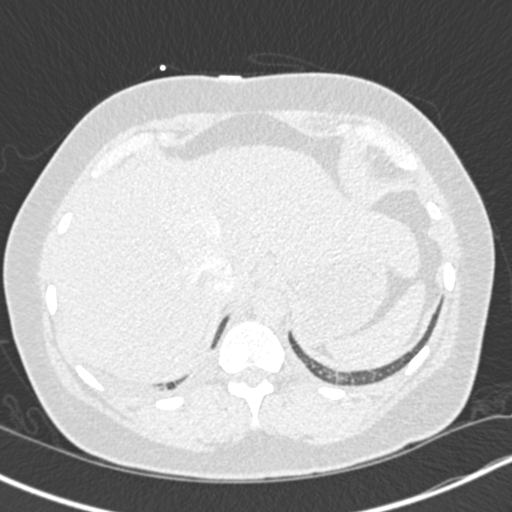
[im 67/306  soft-tissue]
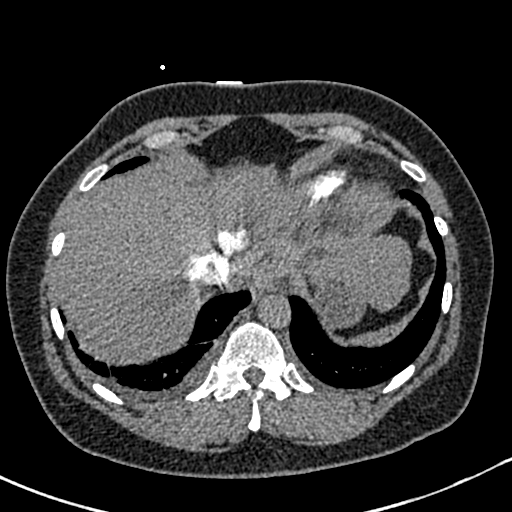
[im 93/306  lung]
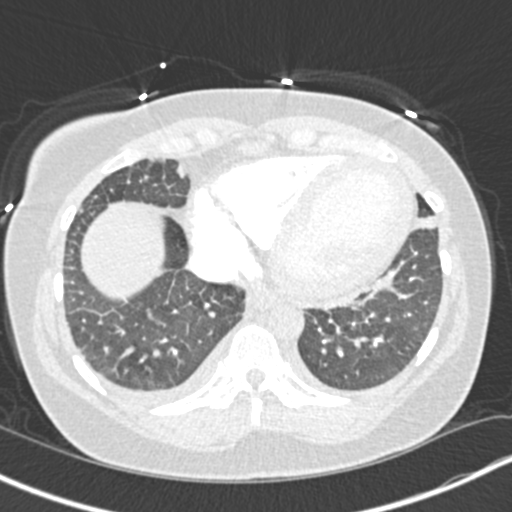
[im 107/306  soft-tissue]
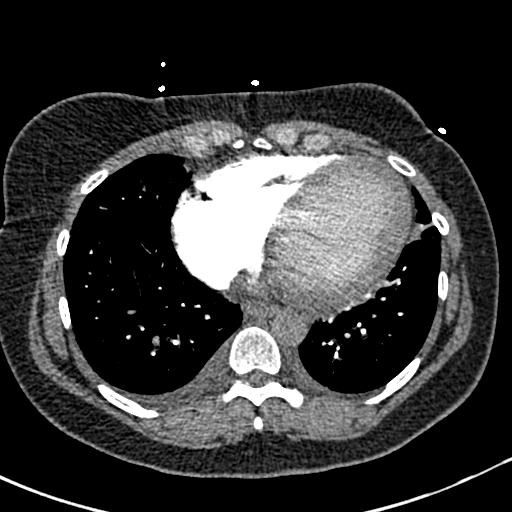
[im 120/306  lung]
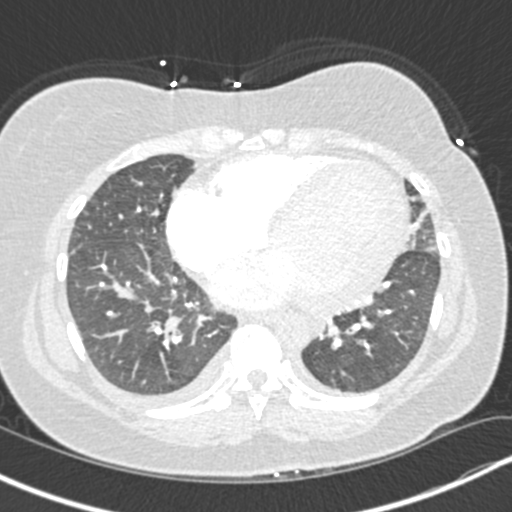
[im 146/306  soft-tissue]
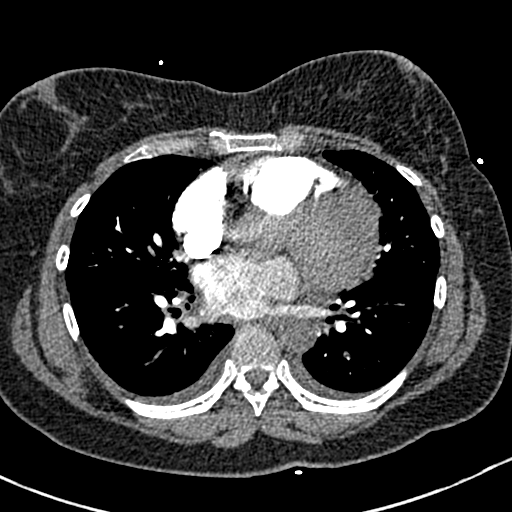
[im 160/306  lung]
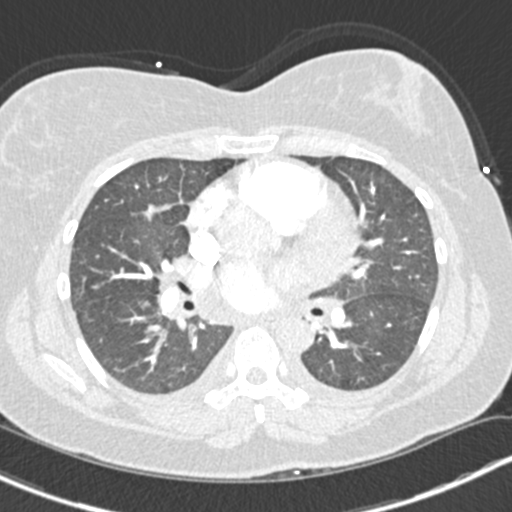
[im 186/306  soft-tissue]
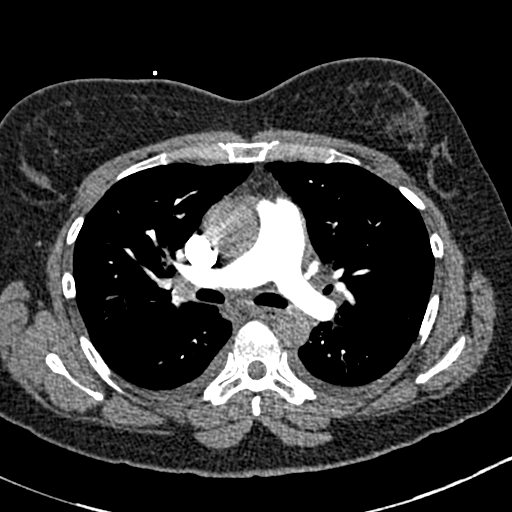
[im 199/306  lung]
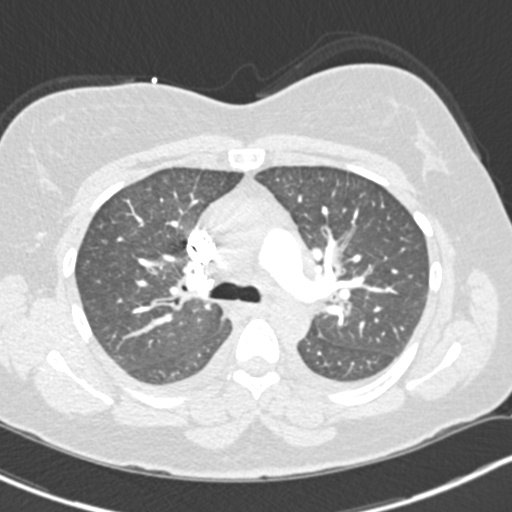
[im 213/306  soft-tissue]
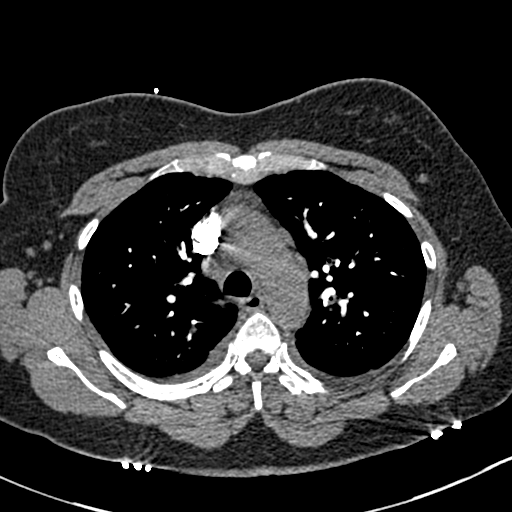
[im 239/306  lung]
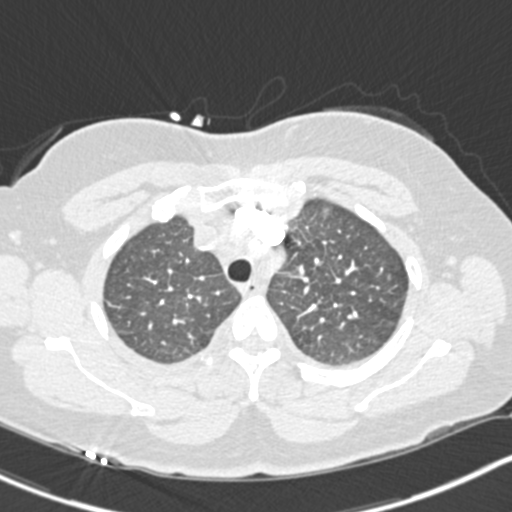
[im 252/306  soft-tissue]
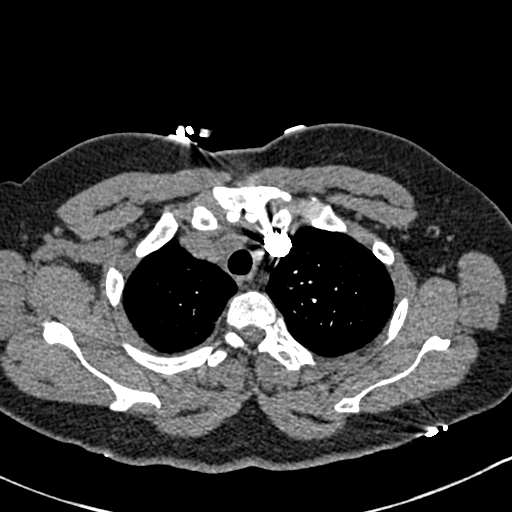
[im 266/306  lung]
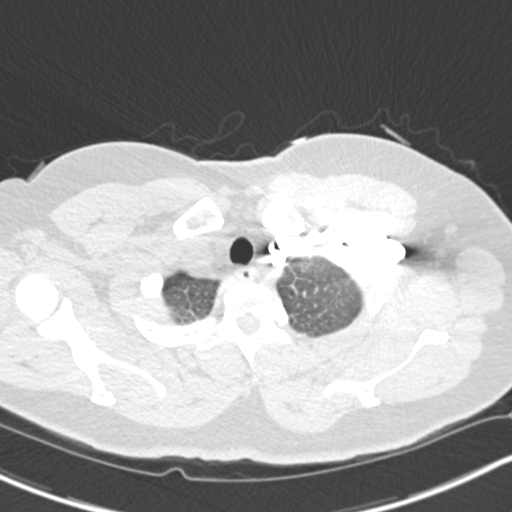
[im 292/306  soft-tissue]
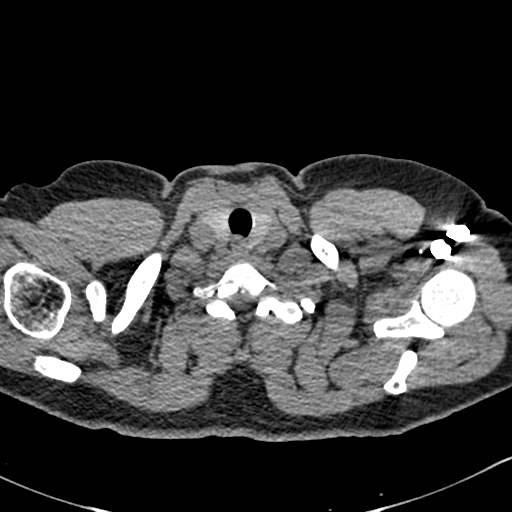

[Series 7: coronal mpr · coronal · 0.60mm/px · 2 of 98 slices shown]
[im 33/98  soft-tissue]
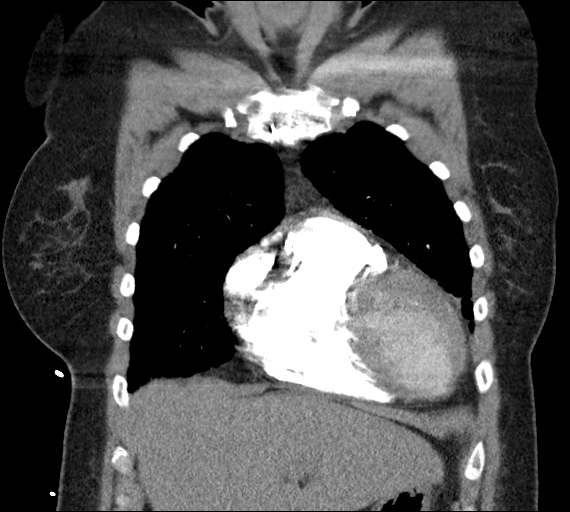
[im 65/98  soft-tissue]
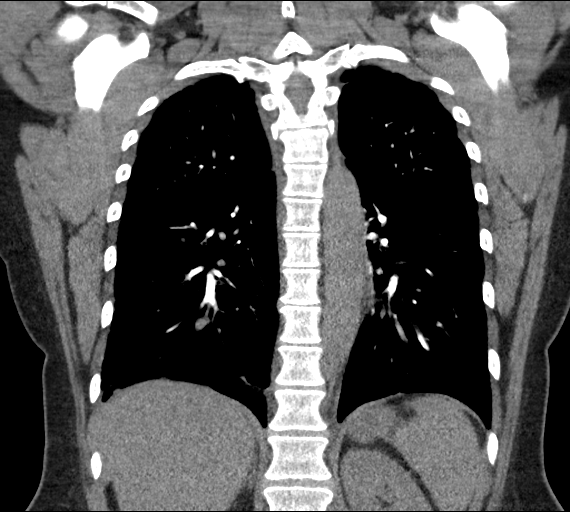

[18 of 46 positions shown; findings below may reference images not displayed]

FINDINGS: Cardiovascular: There is no demonstrable pulmonary embolus. There is
no thoracic aortic aneurysm. No dissection evident. Note that the
contrast bolus in the aorta is not sufficient for potential
dissection assessment. Visualized great vessels appear unremarkable.
There is no pericardial effusion or pericardial thickening. There is
a degree of cardiomegaly.

Mediastinum/Nodes: Visualized thyroid appears normal. There are
several subcentimeter lymph nodes. There is an apparent prominent
subcarinal lymph node measuring 1.8 x 1.5 cm. No esophageal lesions
are appreciable.

Lungs/Pleura: There are small pleural effusions bilaterally. There
is atelectatic change in the inferior lingula and right base
regions. There is slight lobular septal thickening in the bases
which may represent a mild degree of interstitial edema. No
appreciable airspace consolidation. No pneumothorax. Trachea and
major bronchial structures appear patent.

Upper Abdomen: There is reflux of contrast into the inferior vena
cava and hepatic veins. Visualized upper abdominal structures
otherwise appear unremarkable.

Musculoskeletal: No blastic or lytic bone lesions. No evident chest
wall lesions.

Review of the MIP images confirms the above findings.
IMPRESSION: 1. No evident pulmonary embolus. No thoracic aortic aneurysm.
Contrast bolus in the aorta is not sufficient for potential
dissection assessment. No pericardial thickening or pericardial
effusion evident.

2. There is a degree of cardiomegaly. There are small pleural
effusions bilaterally with suspected bibasilar interstitial edema.
Suspect developing congestive heart failure. Areas of atelectatic
change noted without airspace consolidation.

3. Enlargement of a subcarinal lymph node of uncertain etiology.
Scattered subcentimeter lymph nodes elsewhere.

4. Reflux of contrast into the inferior vena cava and hepatic veins
is likely indicative of increased right heart pressure.

## 2022-06-24 ENCOUNTER — Ambulatory Visit: Payer: Medicare Other | Admitting: Internal Medicine

## 2024-06-04 ENCOUNTER — Encounter (HOSPITAL_COMMUNITY): Payer: Self-pay

## 2024-06-04 ENCOUNTER — Ambulatory Visit (HOSPITAL_COMMUNITY)
Admission: EM | Admit: 2024-06-04 | Discharge: 2024-06-04 | Disposition: A | Discharge status: Home / Self Care | Attending: Family Medicine | Admitting: Family Medicine

## 2024-06-04 DIAGNOSIS — F411 Generalized anxiety disorder: Secondary | ICD-10-CM

## 2024-06-04 DIAGNOSIS — F410 Panic disorder [episodic paroxysmal anxiety] without agoraphobia: Secondary | ICD-10-CM

## 2024-06-04 MED ORDER — HYDROXYZINE HCL 25 MG PO TABS: 25.0000 mg | ORAL_TABLET | Freq: Four times a day (QID) | ORAL | 0 refills | Status: AC

## 2024-06-04 NOTE — ED Provider Notes (Signed)
 [facility]    CSN: [id number] Arrival date & time: 06/04/24  1301      History   Chief Complaint No chief complaint on file.   HPI Theresa Boyer is a 48 y.o. female.   Patient is here for a refill of klonopin.  She has been on this medication for a long time and has been out of her medication for the last 5 days.  She has called her pcp but has not heard back.  She takes klonopin 1-2 times/day as needed.  Her last RF was 8/22 for 45 pills.   She has been having a panic attack, and having a hard time functioning.   She was going to go a psych ER, but did not think she had time to do so.        Past Medical History:  Diagnosis Date   Abnormal Pap smear    Anxiety    Asthma    Bipolar 1 disorder (HCC)    Melanoma (HCC)     Patient Active Problem List   Diagnosis Date Noted   Chronic systolic CHF (congestive heart failure) (HCC) 09/29/2020   Bipolar 1 disorder (HCC)    Obesity (BMI 30-39.9)    Polysubstance abuse (HCC)    Tobacco abuse    Acute systolic CHF (congestive heart failure) (HCC) 09/28/2020   Opiate addiction (HCC) 06/20/2011   Cesarean delivery delivered 06/20/2011    Past Surgical History:  Procedure Laterality Date   CESAREAN SECTION     x 2    OB History     Gravida  3   Para  2   Term  2   Preterm  0   AB  1   Living  2      SAB  0   IAB  1   Ectopic  0   Multiple  0   Live Births  1            Home Medications    Prior to Admission medications   Medication Sig Start Date End Date Taking? Authorizing Provider  Albuterol Sulfate (PROAIR RESPICLICK) 108 (90 Base) MCG/ACT AEPB Inhale into the lungs. 04/11/20  Yes [provider]  clonazePAM (KLONOPIN) 1 MG tablet TAKE ONE-HALF TO 1 TABLET BY MOUTH EVERY 6 HOURS AS NEEDED FOR SEVERE ANXIETY 08/11/20  Yes [provider]  venlafaxine XR (EFFEXOR-XR) 75 MG 24 hr capsule Take 75 mg by mouth daily with breakfast. 04/25/20  Yes [provider]  carvedilol  (COREG ) 6.25 MG tablet Take 1 tablet (6.25 mg total) by mouth 2 (two) times daily with a meal. 09/30/20 12/29/20  Shona Terry SAILOR, DO  divalproex (DEPAKOTE ER) 500 MG 24 hr tablet  03/08/20   [provider]  furosemide  (LASIX ) 40 MG tablet Take 1 tablet (40 mg total) by mouth 2 (two) times daily. 09/30/20 11/29/20  Shona Terry N, DO  LATUDA 20 MG TABS tablet Take 20 mg by mouth at bedtime. 04/24/20   [provider]  lisinopril  (ZESTRIL ) 10 MG tablet Take 1 tablet (10 mg total) by mouth daily. 10/01/20 12/30/20  Shona Terry SAILOR, DO  lithium carbonate (LITHOBID) 300 MG CR tablet Take 300 mg by mouth 2 (two) times daily. 08/22/20   [provider]  potassium chloride  SA (KLOR-CON ) 20 MEQ tablet Take 2 tablets (40 mEq total) by mouth daily. 09/30/20 11/29/20  Shona Terry SAILOR, DO    Family History History reviewed. No pertinent family history.  Social History Social History   Tobacco Use   Smoking status: Every Day    Current packs/day: 0.50    Types: Cigarettes   Smokeless tobacco: Never  Substance Use Topics   Alcohol use: No   Drug use: Yes    Comment: Hx heroin use, States none for past 2 yrs. On Methadone  currently     Allergies   Lamictal [lamotrigine] and Wellbutrin [bupropion hcl]   Review of Systems Review of Systems  Constitutional: Negative.   HENT: Negative.    Respiratory: Negative.    Cardiovascular: Negative.   Gastrointestinal: Negative.   Psychiatric/Behavioral:  The patient is nervous/anxious.      Physical Exam Triage Vital Signs ED Triage Vitals  Encounter Vitals Group     BP 06/04/24 1331 136/65     Girls Systolic BP Percentile --      Girls Diastolic BP Percentile --      Boys Systolic BP Percentile --      Boys Diastolic BP Percentile --      Pulse Rate 06/04/24 1331 88     Resp 06/04/24 1331 18     Temp --      Temp src --      SpO2 06/04/24 1331 97 %     Weight --      Height --      Head  Circumference --      Peak Flow --      Pain Score 06/04/24 1333 0     Pain Loc --      Pain Education --      Exclude from Growth Chart --    No data found.  Updated Vital Signs BP 136/65 (BP Location: Left Arm)   Pulse 88   Resp 18   LMP 05/10/2024 (Approximate)   SpO2 97%   Breastfeeding No   Visual Acuity Right Eye Distance:   Left Eye Distance:   Bilateral Distance:    Right Eye Near:   Left Eye Near:    Bilateral Near:     Physical Exam Constitutional:      Appearance: Normal appearance. She is normal weight.  Cardiovascular:     Rate and Rhythm: Normal rate.  Pulmonary:     Effort: Pulmonary effort is normal.     Breath sounds: Normal breath sounds.  Neurological:     General: No focal deficit present.     Mental Status: She is alert and oriented to person, place, and time.  Psychiatric:        Mood and Affect: Mood normal.     Comments: Appears anxious, but has good eye contact and answers all questions appropriately today      UC Treatments / Results  Labs (all labs ordered are listed, but only abnormal results are displayed) Labs Reviewed - No data to display  EKG   Radiology No results found.  Procedures Procedures (including critical care time)  Medications Ordered in UC Medications - No data to display  Initial Impression / Assessment and Plan / UC Course  I have reviewed the triage vital signs and the nursing notes.  Pertinent labs & imaging results that were available during my care of the patient were reviewed by me and considered in my medical decision making (see chart for details).    Patient is here today for panic/anxiety, and being out of her medication x 5 days.  She is not exactly asking for a refill of her klonopin, but asking for something  here to help.  Discussed that we really do not having anything to offer while in office.  I can send out hydroxyzine  to see if helpful over the weekend.  Away she is also under controlled  med contract with her pcp.  I have given her the information for Behavioral Health if she needs this over the weekend.   Final Clinical Impressions(s) / UC Diagnoses   Final diagnoses:  Panic attack  Anxiety state     Discharge Instructions      You were seen today for anxiety/panic state.   As discussed, I am unable to refill the klonipin for your.  I have sent out hydroxyzine  to see if helpful.  Please reach out to your primary care provider for further discussion.  I have given you information on the Behavioral Health Urgent care, in case you feel you need this over the weekend.     ED Prescriptions     Medication Sig Dispense Auth. Provider   hydrOXYzine  (ATARAX ) 25 MG tablet Take 1 tablet (25 mg total) by mouth every 6 (six) hours. 12 tablet Darral Longs, MD      PDMP not reviewed this encounter.   Darral Longs, MD 06/04/24 1357

## 2024-06-04 NOTE — Discharge Instructions (Signed)
 You were seen today for anxiety/panic state.   As discussed, I am unable to refill the klonipin for your.  I have sent out hydroxyzine  to see if helpful.  Please reach out to your primary care provider for further discussion.  I have given you information on the Behavioral Health Urgent care, in case you feel you need this over the weekend.

## 2024-06-04 NOTE — ED Triage Notes (Signed)
 Patient is requesting a refill on her Klonopin. Patient states she been 5-days without this medication. Patient is currently speaking with her head down and rocking back and forth.

## 2024-06-20 ENCOUNTER — Other Ambulatory Visit: Payer: Self-pay

## 2024-06-20 ENCOUNTER — Emergency Department (HOSPITAL_COMMUNITY): Payer: Self-pay

## 2024-06-20 ENCOUNTER — Encounter (HOSPITAL_COMMUNITY): Payer: Self-pay | Admitting: *Deleted

## 2024-06-20 ENCOUNTER — Emergency Department (HOSPITAL_COMMUNITY)
Admission: EM | Admit: 2024-06-20 | Discharge: 2024-06-20 | Discharge status: Against Medical Advice | Payer: Self-pay | Attending: Emergency Medicine | Admitting: Emergency Medicine

## 2024-06-20 DIAGNOSIS — S90122A Contusion of left lesser toe(s) without damage to nail, initial encounter: Secondary | ICD-10-CM | POA: Insufficient documentation

## 2024-06-20 DIAGNOSIS — S90112A Contusion of left great toe without damage to nail, initial encounter: Secondary | ICD-10-CM | POA: Insufficient documentation

## 2024-06-20 DIAGNOSIS — W1830XA Fall on same level, unspecified, initial encounter: Secondary | ICD-10-CM | POA: Insufficient documentation

## 2024-06-20 DIAGNOSIS — Z5321 Procedure and treatment not carried out due to patient leaving prior to being seen by health care provider: Secondary | ICD-10-CM | POA: Insufficient documentation

## 2024-06-20 NOTE — ED Triage Notes (Signed)
 2 days ago the pt fell injuring the toes on her lt foot  all 5 toes are bruised and swollen and painful

## 2024-06-20 NOTE — ED Notes (Signed)
 Pt called x2 for treatment room, no response. Pt not visualized in lobby.

## 2024-07-21 ENCOUNTER — Ambulatory Visit: Payer: Self-pay | Admitting: Psychology

## 2024-07-21 DIAGNOSIS — Z09 Encounter for follow-up examination after completed treatment for conditions other than malignant neoplasm: Secondary | ICD-10-CM

## 2024-07-29 ENCOUNTER — Other Ambulatory Visit: Payer: Self-pay | Admitting: Psychology

## 2024-08-04 ENCOUNTER — Other Ambulatory Visit: Payer: Self-pay | Admitting: Psychology

## 2024-08-04 NOTE — Progress Notes (Deleted)
 Client Name: Date: 07/21/2024 Duration: 30 min Clinician/Intern: Molly Grana, MSW Intern  Case Management Note: Client arrived 45 minutes late to the appointment, leaving the intern with 20 minutes to meet with her. 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 Upon arrival, the intern had the client complete patient paperwork. The client discussed her past and current situations and shared her interest in seeking therapy for herself and her daughters. She also expressed concern about her younger daughter's intensive mental health needs. The intern and the team are currently assisting the client with case management for her daughter's mental health needs. The intern and client will continue to meet as needed to address ongoing case management needs before beginning therapy discussions. The client is scheduled for another case management appointment on 07/29/2024. Clinician Signature: Molly Grana, MSW Intern   Client Name: Date: 07/29/2024 Duration:  Clinician/Intern: Molly Grana, MSW Intern  Case Management Note: The client did not show for the scheduled appointment.  Clinician Signature: Molly Grana, MSW Intern  Client Name: Date: 08/04/2024 Duration:  Clinician/Intern: Molly Grana, MSW Intern  Case Management Note: The client did not show for the scheduled appointment.  Clinician Signature: Molly Grana, MSW Intern

## 2024-08-04 NOTE — Clinical Social Work Note (Signed)
 Client Name: Theresa Boyer Date: 07/21/2024 Duration: 30 min Clinician/Intern: Molly Grana, MSW Intern  Case Management Note: Client arrived 45 minutes late to the appointment, leaving the intern with 20 minutes to meet with her. 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 Upon arrival, the intern had the client complete patient paperwork. The client discussed her past and current situations and shared her interest in seeking therapy for herself and her daughters. She also expressed concern about her younger daughter's intensive mental health needs. The intern and the team are currently assisting the client with case management for her daughter's mental health needs. The intern and client will continue to meet as needed to address ongoing case management needs before beginning therapy discussions. The client is scheduled for another case management appointment on 07/29/2024.  Clinician Signature: Molly Grana, MSW Intern

## 2024-08-09 ENCOUNTER — Ambulatory Visit: Payer: Self-pay | Admitting: Psychology

## 2024-08-09 DIAGNOSIS — F4310 Post-traumatic stress disorder, unspecified: Secondary | ICD-10-CM

## 2024-08-09 DIAGNOSIS — F314 Bipolar disorder, current episode depressed, severe, without psychotic features: Secondary | ICD-10-CM

## 2024-08-09 DIAGNOSIS — F411 Generalized anxiety disorder: Secondary | ICD-10-CM

## 2024-08-18 ENCOUNTER — Other Ambulatory Visit: Payer: Self-pay | Admitting: Psychology

## 2024-09-06 ENCOUNTER — Encounter: Payer: Self-pay | Admitting: Emergency Medicine

## 2024-09-06 ENCOUNTER — Emergency Department

## 2024-09-06 ENCOUNTER — Other Ambulatory Visit: Payer: Self-pay

## 2024-09-06 DIAGNOSIS — R42 Dizziness and giddiness: Secondary | ICD-10-CM | POA: Diagnosis not present

## 2024-09-06 DIAGNOSIS — Z5321 Procedure and treatment not carried out due to patient leaving prior to being seen by health care provider: Secondary | ICD-10-CM | POA: Insufficient documentation

## 2024-09-06 DIAGNOSIS — R0602 Shortness of breath: Secondary | ICD-10-CM | POA: Insufficient documentation

## 2024-09-06 DIAGNOSIS — R079 Chest pain, unspecified: Secondary | ICD-10-CM | POA: Insufficient documentation

## 2024-09-06 LAB — BASIC METABOLIC PANEL WITH GFR
Anion gap: 10 (ref 5–15)
BUN: 13 mg/dL (ref 6–20)
CO2: 25 mmol/L (ref 22–32)
Calcium: 8.7 mg/dL — ABNORMAL LOW (ref 8.9–10.3)
Chloride: 106 mmol/L (ref 98–111)
Creatinine, Ser: 0.82 mg/dL (ref 0.44–1.00)
GFR, Estimated: >60 mL/min
Glucose, Bld: 91 mg/dL (ref 70–99)
Potassium: 4.1 mmol/L (ref 3.5–5.1)
Sodium: 140 mmol/L (ref 135–145)

## 2024-09-06 LAB — CBC
HCT: 39.9 % (ref 36.0–46.0)
Hemoglobin: 13.2 g/dL (ref 12.0–15.0)
MCH: 30.1 pg (ref 26.0–34.0)
MCHC: 33.1 g/dL (ref 30.0–36.0)
MCV: 90.9 fL (ref 80.0–100.0)
Platelets: 300 K/uL (ref 150–400)
RBC: 4.39 MIL/uL (ref 3.87–5.11)
RDW: 12 % (ref 11.5–15.5)
WBC: 6.8 K/uL (ref 4.0–10.5)
nRBC: 0 % (ref 0.0–0.2)

## 2024-09-06 LAB — TROPONIN T, HIGH SENSITIVITY: Troponin T High Sensitivity: <15 ng/L (ref 0–19)

## 2024-09-06 LAB — POC URINE PREG, ED: Preg Test, Ur: NEGATIVE

## 2024-09-06 NOTE — ED Triage Notes (Signed)
 [facility] with c/o CP radiating into left arm; has pacemaker

## 2024-09-06 NOTE — ED Triage Notes (Addendum)
 Pt arrives from jail c/o CP w/ radiation to left arm that started 45 minutes w/ sob and dizziness. Pt states she was standing there talking to another inmate when the pain started suddenly. Pt reports it feels like someone is sitting on her chest. NADN. Pt reports not been taking her meds x 1 month.

## 2024-09-07 ENCOUNTER — Emergency Department
Admission: EM | Admit: 2024-09-07 | Discharge: 2024-09-07 | Discharge status: Against Medical Advice | Attending: Emergency Medicine | Admitting: Emergency Medicine

## 2024-09-07 LAB — TROPONIN T, HIGH SENSITIVITY: Troponin T High Sensitivity: <15 ng/L (ref 0–19)

## 2024-09-22 NOTE — Progress Notes (Deleted)
 Case Management 07/20/2024

## 2024-09-30 NOTE — Progress Notes (Unsigned)
 Client Name: Theresa Boyer Date: 07/21/2024 Duration: 30 min Clinician/Intern: Molly Grana, MSW Intern  Case Management Note: Client arrived 45 minutes late to the appointment, leaving the intern with 20 minutes to meet with her. 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 Upon arrival, the intern had the client complete patient paperwork. The client discussed her past and current situations and shared her interest in seeking therapy for herself and her daughters. She also expressed concern about her younger daughters intensive mental health needs. The intern and the team are currently assisting the client with case management for her daughters mental health needs. The intern and client will continue to meet as needed to address ongoing case management needs before beginning therapy discussions. The client is scheduled for another case management appointment on 07/29/2024.  Clinician Signature: Molly Grana, MSW Intern

## 2024-10-01 ENCOUNTER — Ambulatory Visit: Payer: Self-pay | Admitting: Psychology

## 2024-10-01 NOTE — Progress Notes (Signed)
 [facility] note  Client name: Monifah Freehling Therapist name: Camie Norris Higinio Chang, [place] Date: 08/09/2024 Time: 60 mins.  Data: This was the initial appointment with the client. The client was tearful through the whole session. The client reports that things have been hard lately. She states that the father of her children took one of her kids without permission and that she does not have to means or resources to fight for them. She states that he is not good for them and part of the reason she is so messed up is because of what he put her through. She reports that she feels lost and hopeless and she just does not know what to do.  Assessment: Client presented anxious and tearful. She was alert and oriented. She had pressured speech and talked in circles through most of the session. She also jumped from one topic to another. The conversation felt chaotic. The client has been through a lot in life and it seems that she has not properly dealt with it. We discussed that she would possibly benefit from some complex trama therapy and she was open to that.  Plan: [facility] will help the client to find someone who can support the need for the complex trama therapy. A clinical assessment will be done if needed for the referral. We will continue to support the client through case management needs. We will also continue to see her other daughter for therapeutical services.

## 2024-10-21 NOTE — Progress Notes (Unsigned)
 Client Name: Theresa Boyer Date: 10/01/2024 Clinician/Intern: Molly Grana, MSW Intern  Case Management Note: The intern contacted the client via phone for a check-in. Client reported multiple changes since last contact. She stated she has been unable to attend appointments due to an outstanding warrant related to missed court dates involving the biological fathers request to see their daughters. Client reported plans to attend her next court date. Client stated both daughters are currently with their biological father and offered to provide his phone number if contact with the older daughter is needed. The client reported that the biological father removed the younger daughter from school. Client requested assistance with an apartment search, stating her current lease ends in February. The clients affect was observed as anxious and tearful during the call. Client appears to be experiencing increased stress related to legal matters, custody changes, and housing instability. Intern and supervisor will remain available to provide case management support, including housing resources, if the client chooses to continue services.  Clinician Signature: Molly Grana, MSW Intern
